# Patient Record
Sex: Female | Born: 2000 | Race: Black or African American | Hispanic: No | Marital: Single | Smoking: Never smoker
Health system: Southern US, Community
[De-identification: ages and names within clinical notes are randomized; demographics above are authoritative.]

## PROBLEM LIST (undated history)

## (undated) DIAGNOSIS — IMO0001 Reserved for inherently not codable concepts without codable children: Secondary | ICD-10-CM

## (undated) HISTORY — DX: Reserved for inherently not codable concepts without codable children: IMO0001

---
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
## 2019-08-19 ENCOUNTER — Encounter: Payer: Medicaid Other | Admitting: Family Medicine

## 2019-12-11 NOTE — Progress Notes (Deleted)
    SUBJECTIVE:   CHIEF COMPLAINT / HPI:   ***  PERTINENT  PMH / PSH: ***  OBJECTIVE:   There were no vitals taken for this visit.  ***  ASSESSMENT/PLAN:   No problem-specific Assessment & Plan notes found for this encounter.     Imaya Duffy, MD [facility]   

## 2019-12-11 NOTE — Patient Instructions (Incomplete)
It was a pleasure to see you today!  1.    Be Well,  Dr. Mahoney  

## 2019-12-12 ENCOUNTER — Ambulatory Visit: Payer: Medicaid Other | Admitting: Family Medicine

## 2019-12-20 ENCOUNTER — Ambulatory Visit (INDEPENDENT_AMBULATORY_CARE_PROVIDER_SITE_OTHER): Payer: Medicaid Other | Admitting: Family Medicine

## 2019-12-20 ENCOUNTER — Other Ambulatory Visit: Payer: Self-pay

## 2019-12-20 ENCOUNTER — Encounter: Payer: Self-pay | Admitting: Family Medicine

## 2019-12-20 VITALS — BP 92/72 | HR 73 | Wt 136.4 lb

## 2019-12-20 DIAGNOSIS — K5904 Chronic idiopathic constipation: Secondary | ICD-10-CM | POA: Diagnosis not present

## 2019-12-20 DIAGNOSIS — K581 Irritable bowel syndrome with constipation: Secondary | ICD-10-CM

## 2019-12-20 DIAGNOSIS — R1110 Vomiting, unspecified: Secondary | ICD-10-CM

## 2019-12-20 LAB — POCT URINE PREGNANCY: Preg Test, Ur: NEGATIVE

## 2019-12-20 MED ORDER — POLYETHYLENE GLYCOL 3350 17 GM/SCOOP PO POWD: 17.0000 g | Freq: Four times a day (QID) | ORAL | 6 refills | Status: DC

## 2019-12-20 NOTE — Progress Notes (Signed)
    SUBJECTIVE:   CHIEF COMPLAINT / HPI: nausea and vomiting  Reports worsening vomiting since middle of November.  Reports was 145 pounds and now down to 136 pounds.  Continues to have an appetite but unable to keep food down.  Reports vomiting 30 minutes after eating.  Feels like stomach is clenching after she eats.  Denies any hematemesis or bile in vomit.  She is able to keep down fluids.  Reports that she has changed her diet as she thought she may been lactose intolerant.  Has decreased fatty food intake.  Denies any fevers, urinary symptoms, vaginal bleeding or discharge.  Has had one episode of frothy diarrhea.  Denies any constipation.  Reports last bowel movement today.  Normal bowel movements have been 1-2 a month.  Endorses eating ice but no other pica symptoms.  Denies any bloody stool.  Reports has a history of constipation and was taking MiraLAX but stopped as it did not work.    PERTINENT  PMH / PSH:  Chronic constipation  OBJECTIVE:   BP 92/72   Pulse 73   Wt 136 lb 6.4 oz (61.9 kg)   LMP 11/25/2019   SpO2 99%   BMI 24.16 kg/m    General: Alert and oriented, no apparent distress  ENTM: No pharyengeal erythema, mucus membres moist Neck: nontender, no thyromegaly Cardiovascular: RRR with no murmurs noted Respiratory: CTA bilaterally  Gastrointestinal: Soft and firm but no distension.  Hypoactive Bowel sounds. RLQ abdominal pain.  Negative rebound, Murphy's sign.  No peritoneal signs Psych: Behavior and speech appropriate to situation. No SI/HI  ASSESSMENT/PLAN:   Chronic idiopathic constipation Has bowel movement every 1-2 months. History of constipation but stopped taking Miralax as it didn't work.  Abd exam soft but firm and mild tenderness to deep palpation on RLQ. No peritoneal signs.  Likely nausea/vomiting from constipation secondary to IBS -KUB today -CBC and CMP today -UPT negative -Miralax QID -Once KUB back can increase bowel regime until moves  bowels -Educated on the importance to continue to have a bowel movement at least every 2-3 days and not months  -Will call patient with results when received -If unable to move bowels and continues to have nausea and vomiting likely will need GI referral for possible admission to relieve stool burden. -Follow up with PCP as needed      Dana Allan, MD [facility]

## 2019-12-20 NOTE — Patient Instructions (Addendum)
It was nice meeting you  today!  Take Miralax 1 scoop 4 times a day until you have a bowel movement  I will call you with the results of your blood work and xray.  If you have any questions or concerns, please feel free to call the clinic.   Be well,  Dana Allan, MD [facility]    Irritable Bowel Syndrome, Adult  Irritable bowel syndrome (IBS) is a group of symptoms that affects the organs responsible for digestion (gastrointestinal or GI tract). IBS is not one specific disease. To regulate how the GI tract works, the body sends signals back and forth between the intestines and the brain. If you have IBS, there may be a problem with these signals. As a result, the GI tract does not function normally. The intestines may become more sensitive and overreact to certain things. This may be especially true when you eat certain foods or when you are under stress. There are four types of IBS. These may be determined based on the consistency of your stool (feces):  IBS with diarrhea.  IBS with constipation.  Mixed IBS.  Unsubtyped IBS. It is important to know which type of IBS you have. Certain treatments are more likely to be helpful for certain types of IBS. What are the causes? The exact cause of IBS is not known. What increases the risk? You may have a higher risk for IBS if you:  Are female.  Are younger than 45.  Have a family history of IBS.  Have a mental health condition, such as depression, anxiety, or post-traumatic stress disorder.  Have had a bacterial infection of your GI tract. What are the signs or symptoms? Symptoms of IBS vary from person to person. The main symptom is abdominal pain or discomfort. Other symptoms usually include one or more of the following:  Diarrhea, constipation, or both.  Abdominal swelling or bloating.  Feeling full after eating a small or regular-sized meal.  Frequent gas.  Mucus in the stool.  A feeling of having  more stool left after a bowel movement. Symptoms tend to come and go. They may be triggered by stress, mental health conditions, or certain foods. How is this diagnosed? This condition may be diagnosed based on a physical exam, your medical history, and your symptoms. You may have tests, such as:  Blood tests.  Stool test.  X-rays.  CT scan.  Colonoscopy. This is a procedure in which your GI tract is viewed with a long, thin, flexible tube. How is this treated? There is no cure for IBS, but treatment can help relieve symptoms. Treatment depends on the type of IBS you have, and may include:  Changes to your diet, such as: ? Avoiding foods that cause symptoms. ? Drinking more water. ? Following a low-FODMAP (fermentable oligosaccharides, disaccharides, monosaccharides, and polyols) diet for up to 6 weeks, or as told by your health care provider. FODMAPs are sugars that are hard for some people to digest. ? Eating more fiber. ? Eating medium-sized meals at the same times every day.  Medicines. These may include: ? Fiber supplements, if you have constipation. ? Medicine to control diarrhea (antidiarrheal medicines). ? Medicine to help control muscle tightening (spasms) in your GI tract (antispasmodic medicines). ? Medicines to help with mental health conditions, such as antidepressants or tranquilizers.  Talk therapy or counseling.  Working with a diet and nutrition specialist (dietitian) to help create a food plan that is right for you.  Managing  your stress. Follow these instructions at home: Eating and drinking  Eat a healthy diet.  Eat medium-sized meals at about the same time every day. Do not eat large meals.  Gradually eat more fiber-rich foods. These include whole grains, fruits, and vegetables. This may be especially helpful if you have IBS with constipation.  Eat a diet low in FODMAPs.  Drink enough fluid to keep your urine pale yellow.  Keep a journal of foods  that seem to trigger symptoms.  Avoid foods and drinks that: ? Contain added sugar. ? Make your symptoms worse. Dairy products, caffeinated drinks, and carbonated drinks can make symptoms worse for some people. General instructions  Take over-the-counter and prescription medicines and supplements only as told by your health care provider.  Get enough exercise. Do at least 150 minutes of moderate-intensity exercise each week.  Manage your stress. Getting enough sleep and exercise can help you manage stress.  Keep all follow-up visits as told by your health care provider and therapist. This is important. Alcohol Use  Do not drink alcohol if: ? Your health care provider tells you not to drink. ? You are pregnant, may be pregnant, or are planning to become pregnant.  If you drink alcohol, limit how much you have: ? 0-1 drink a day for women. ? 0-2 drinks a day for men.  Be aware of how much alcohol is in your drink. In the [place] one drink equals one typical bottle of beer (12 oz), one-half glass of wine (5 oz), or one shot of hard liquor (1 oz). Contact a health care provider if you have:  Constant pain.  Weight loss.  Difficulty or pain when swallowing.  Diarrhea that gets worse. Get help right away if you have:  Severe abdominal pain.  Fever.  Diarrhea with symptoms of dehydration, such as dizziness or dry mouth.  Bright red blood in your stool.  Stool that is black and tarry.  Abdominal swelling.  Vomiting that does not stop.  Blood in your vomit. Summary  Irritable bowel syndrome (IBS) is not one specific disease. It is a group of symptoms that affects digestion.  Your intestines may become more sensitive and overreact to certain things. This may be especially true when you eat certain foods or when you are under stress.  There is no cure for IBS, but treatment can help relieve symptoms. This information is not intended to replace advice given to you by your  health care provider. Make sure you discuss any questions you have with your health care provider. Document Revised: 12/16/2016 Document Reviewed: 12/16/2016 Elsevier Patient Education  2020 [employer].

## 2019-12-21 ENCOUNTER — Ambulatory Visit
Admission: RE | Admit: 2019-12-21 | Discharge: 2019-12-21 | Disposition: A | Discharge status: Home / Self Care | Payer: Medicaid Other | Source: Ambulatory Visit | Attending: Family Medicine | Admitting: Family Medicine

## 2019-12-21 DIAGNOSIS — R1110 Vomiting, unspecified: Secondary | ICD-10-CM

## 2019-12-21 LAB — CBC WITH DIFFERENTIAL/PLATELET
Basophils Absolute: 0 10*3/uL (ref 0.0–0.2)
Basos: 1 %
EOS (ABSOLUTE): 0 10*3/uL (ref 0.0–0.4)
Eos: 1 %
Hematocrit: 33.3 % — ABNORMAL LOW (ref 34.0–46.6)
Hemoglobin: 10.8 g/dL — ABNORMAL LOW (ref 11.1–15.9)
Immature Grans (Abs): 0 10*3/uL (ref 0.0–0.1)
Immature Granulocytes: 0 %
Lymphocytes Absolute: 3.5 10*3/uL — ABNORMAL HIGH (ref 0.7–3.1)
Lymphs: 56 %
MCH: 25.3 pg — ABNORMAL LOW (ref 26.6–33.0)
MCHC: 32.4 g/dL (ref 31.5–35.7)
MCV: 78 fL — ABNORMAL LOW (ref 79–97)
Monocytes Absolute: 0.7 10*3/uL (ref 0.1–0.9)
Monocytes: 11 %
Neutrophils Absolute: 1.9 10*3/uL (ref 1.4–7.0)
Neutrophils: 31 %
Platelets: 198 10*3/uL (ref 150–450)
RBC: 4.27 x10E6/uL (ref 3.77–5.28)
RDW: 16.7 % — ABNORMAL HIGH (ref 11.7–15.4)
WBC: 6 10*3/uL (ref 3.4–10.8)

## 2019-12-21 LAB — COMPREHENSIVE METABOLIC PANEL
ALT: 9 IU/L (ref 0–32)
AST: 13 IU/L (ref 0–40)
Albumin/Globulin Ratio: 1.3 (ref 1.2–2.2)
Albumin: 4.4 g/dL (ref 3.9–5.0)
Alkaline Phosphatase: 63 IU/L (ref 42–106)
BUN/Creatinine Ratio: 7 — ABNORMAL LOW (ref 9–23)
BUN: 7 mg/dL (ref 6–20)
Bilirubin Total: 0.8 mg/dL (ref 0.0–1.2)
CO2: 23 mmol/L (ref 20–29)
Calcium: 9.9 mg/dL (ref 8.7–10.2)
Chloride: 104 mmol/L (ref 96–106)
Creatinine, Ser: 0.95 mg/dL (ref 0.57–1.00)
GFR calc Af Amer: 100 mL/min/{1.73_m2} (ref >59)
GFR calc non Af Amer: 86 mL/min/{1.73_m2} (ref >59)
Globulin, Total: 3.5 g/dL (ref 1.5–4.5)
Glucose: 78 mg/dL (ref 65–99)
Potassium: 4.1 mmol/L (ref 3.5–5.2)
Sodium: 139 mmol/L (ref 134–144)
Total Protein: 7.9 g/dL (ref 6.0–8.5)

## 2019-12-22 ENCOUNTER — Telehealth: Payer: Self-pay | Admitting: Family Medicine

## 2019-12-22 NOTE — Telephone Encounter (Signed)
Called to discuss results of xray and labs.  LVM to return call.    Dana Allan, MD [facility]

## 2019-12-23 ENCOUNTER — Telehealth: Payer: Self-pay | Admitting: Family Medicine

## 2019-12-23 ENCOUNTER — Encounter: Payer: Self-pay | Admitting: Family Medicine

## 2019-12-23 ENCOUNTER — Other Ambulatory Visit: Payer: Self-pay | Admitting: Family Medicine

## 2019-12-23 MED ORDER — SENNA 8.6 MG PO TABS: 1.0000 | ORAL_TABLET | Freq: Two times a day (BID) | ORAL | 0 refills | Status: DC

## 2019-12-23 NOTE — Progress Notes (Signed)
Spoke with patient about results of abdominal xray and large amount of stool seen.  Advised to increase Miralax to 2.5 scoops TID and added Senna BID. Increase water intake and fiber. If no BM over the weekend patient will call clinic and follow up with PCP.     Dana Allan, MD [facility]

## 2019-12-23 NOTE — Telephone Encounter (Signed)
Called patient to discuss results of recent labs and xray.  No answer.  Unsure if right number.  Was able to contact Grandmother to confirm correct number in chart.  Will attempt to call patient after 4 pm today.  Dana Allan, MD [facility]

## 2019-12-23 NOTE — Assessment & Plan Note (Addendum)
Has bowel movement every 1-2 months. History of constipation but stopped taking Miralax as it didn't work.  Abd exam soft but firm and mild tenderness to deep palpation on RLQ. No peritoneal signs.  Likely nausea/vomiting from constipation secondary to IBS -KUB today -CBC and CMP today -UPT negative -Miralax QID -Once KUB back can increase bowel regime until moves bowels -Educated on the importance to continue to have a bowel movement at least every 2-3 days and not months  -Will call patient with results when received -If unable to move bowels and continues to have nausea and vomiting likely will need GI referral for possible admission to relieve stool burden. -Follow up with PCP as needed

## 2019-12-23 NOTE — Assessment & Plan Note (Deleted)
Has bowel movement every 1-2 months. History of constipation but stopped taking Miralax as it didn't work.  Abd exam soft but firm and mild tenderness to deep palpation on RLQ. No peritoneal signs.  Likely vomiting secondary from constipation. -KUB today -CBC and CMP today -UPT negative -Miralax BID -Once KUB back can increase bowel regime until moves bowels -Educated on the importance to continue to have a bowel movement at least every 2-3 days and not months  -Will call patient with results when received -Follow up with PCP as needed

## 2020-01-02 ENCOUNTER — Other Ambulatory Visit: Payer: Self-pay

## 2020-01-02 DIAGNOSIS — U071 COVID-19: Secondary | ICD-10-CM | POA: Diagnosis not present

## 2020-01-02 DIAGNOSIS — R0600 Dyspnea, unspecified: Secondary | ICD-10-CM | POA: Diagnosis not present

## 2020-01-02 DIAGNOSIS — R509 Fever, unspecified: Secondary | ICD-10-CM | POA: Diagnosis present

## 2020-01-02 DIAGNOSIS — J189 Pneumonia, unspecified organism: Secondary | ICD-10-CM | POA: Diagnosis not present

## 2020-01-02 LAB — RESP PANEL BY RT-PCR (FLU A&B, COVID) ARPGX2
Influenza A by PCR: NEGATIVE
Influenza B by PCR: NEGATIVE
SARS Coronavirus 2 by RT PCR: POSITIVE — AB

## 2020-01-02 NOTE — ED Triage Notes (Signed)
Pt in with co fever, body aches and headache since yesterday. Pt states she felt shob, no distress noted at this time.

## 2020-01-03 ENCOUNTER — Emergency Department
Admission: EM | Admit: 2020-01-03 | Discharge: 2020-01-03 | Disposition: A | Discharge status: Home / Self Care | Payer: Medicaid Other | Attending: Emergency Medicine | Admitting: Emergency Medicine

## 2020-01-03 ENCOUNTER — Emergency Department: Payer: Medicaid Other

## 2020-01-03 DIAGNOSIS — J189 Pneumonia, unspecified organism: Secondary | ICD-10-CM | POA: Diagnosis not present

## 2020-01-03 DIAGNOSIS — U071 COVID-19: Secondary | ICD-10-CM

## 2020-01-03 DIAGNOSIS — R0600 Dyspnea, unspecified: Secondary | ICD-10-CM | POA: Diagnosis not present

## 2020-01-03 MED ORDER — HYDROCODONE-HOMATROPINE 5-1.5 MG/5ML PO SYRP: 5.0000 mL | ORAL_SOLUTION | Freq: Four times a day (QID) | ORAL | 0 refills | Status: DC | PRN

## 2020-01-03 MED ORDER — ALBUTEROL SULFATE HFA 108 (90 BASE) MCG/ACT IN AERS: INHALATION_SPRAY | RESPIRATORY_TRACT | 0 refills | Status: DC

## 2020-01-03 MED ORDER — ONDANSETRON 4 MG PO TBDP: ORAL_TABLET | ORAL | 0 refills | Status: DC

## 2020-01-03 NOTE — Discharge Instructions (Signed)
As we discussed, although you have tested positive for COVID-19 (coronavirus), you do not need to be hospitalized at this time.  Read through all the included information including the recommendations from the CDC.  We recommend that you self-quarantine at home with your immediate family only (people with whom you have already been in contact) for 10-14 days after your fever has gone away (without taking medication to make your temperature come down, such as Tylenol (acetaminophen)), after your respiratory symptoms have improved, and after at least 14 days have passed since your symptoms first appeared.  You should have as minimal contact as possible with anyone else including close family as per the CDC paperwork guidelines listed below. Follow-up with your doctor by phone or online as needed and return immediately to the emergency department or call 911 only if you develop new or worsening symptoms that concern you.  If you were prescribed any medications, please use them as instructed.  If you were given information for the COVID-19 antibody infusion treatment clinic, please call them and leave your contact information.  This can be a very effective and important treatment method, and you should discuss with them if you qualify for treatment.  They may even have transportation services available to them from [place] if you require them.  You can find up-to-date information about COVID-19 in [place] by calling the [place] Coronavirus Helpline: 1-866-462-3821. You may also call 2-1-1, or 888-892-1162, or additional resources.  You can also find information online at https://www.ncdhhs.gov/divisions/public-health/coronavirus-disease-2019-covid-19-response-[place], or on the Center for Disease Control (CDC) website at https://www.cdc.gov/coronavirus/2019-ncov/index.html.  

## 2020-01-03 NOTE — ED Provider Notes (Signed)
[facility] Emergency Department Provider Note  ____________________________________________   Event Date/Time   First MD Initiated Contact with Patient 01/03/20 0138     (approximate)  I have reviewed the triage vital signs and the nursing notes.   HISTORY  Chief Complaint Generalized Body Aches, Shortness of Breath (Fever), and Fever    HPI Linda Weiss is a 19 y.o. female with no chronic medical issues other than IBS who presents for evaluation of about 24 hours of COVID-like symptoms including fever, sore throat, loss of smell and taste, cough.  No significant shortness of breath.  Denies chest pain, abdominal pain, nausea, and vomiting.  Positive for general malaise and fatigue.  No known COVID-19 contacts.  She has received one of the immunizations but has not received her second shot yet.  Nothing in particular makes his symptoms better or worse and they are moderate in severity.         Past Medical History:  Diagnosis Date  . Menarche 2012   Age 19, regular every 28 days    Patient Active Problem List   Diagnosis Date Noted  . Vaginal itching 01/12/2018  . Dandruff 11/09/2017  . Eye strain, bilateral 11/09/2017  . Nonintractable episodic headache 11/09/2017  . Acne 04/09/2017  . Chronic idiopathic constipation 06/03/2016  . Contraceptive management 06/03/2016  . Hammertoe of left foot 10/10/2015    No past surgical history on file.  Prior to Admission medications   Medication Sig Start Date End Date Taking? Authorizing Provider  albuterol (VENTOLIN HFA) 108 (90 Base) MCG/ACT inhaler Inhale 2-4 puffs by mouth every 4 hours as needed for wheezing, cough, and/or shortness of breath 01/03/20  Yes Loleta Rose, MD  HYDROcodone-homatropine [facility]) 5-1.5 MG/5ML syrup Take 5 mLs by mouth every 6 (six) hours as needed for cough. 01/03/20  Yes Loleta Rose, MD  ondansetron (ZOFRAN ODT) 4 MG disintegrating tablet Allow 1-2 tablets to  dissolve in your mouth every 8 hours as needed for nausea/vomiting 01/03/20  Yes Loleta Rose, MD  fluconazole (DIFLUCAN) 150 MG tablet Take one tablet today followed by second tablet in 72 hours if symptoms persist. 03/25/18   Freddrick March, MD  polyethylene glycol powder (GLYCOLAX/MIRALAX) 17 GM/SCOOP powder Take 17 g by mouth in the morning, at noon, in the evening, and at bedtime. 12/20/19   Dana Allan, MD  rizatriptan (MAXALT) 5 MG tablet Take 1 tablet (5 mg total) by mouth once as needed for up to 6 days for migraine. May repeat in 2 hours if needed 01/12/18 01/18/18  Dollene Cleveland, DO  senna (SENOKOT) 8.6 MG TABS tablet Take 1 tablet (8.6 mg total) by mouth in the morning and at bedtime. 12/23/19   Dana Allan, MD    Allergies Hydrocodone and Amoxicillin  No family history on file.  Social History Social History   Tobacco Use  . Smoking status: Never Smoker  . Smokeless tobacco: Never Used  Substance Use Topics  . Alcohol use: No  . Drug use: No    Review of Systems Constitutional: Positive for fever, malaise, and fatigue. Eyes: No visual changes. ENT: Positive for sore throat and loss of smell and taste. Cardiovascular: Denies chest pain. Respiratory: Some cough, minimal shortness of breath. Gastrointestinal: No abdominal pain.  No nausea, no vomiting.  No diarrhea.  No constipation. Genitourinary: Negative for dysuria. Musculoskeletal: Negative for neck pain.  Negative for back pain. Integumentary: Negative for rash. Neurological: Negative for headaches, focal weakness or numbness.  ____________________________________________   PHYSICAL EXAM:  VITAL SIGNS: ED Triage Vitals  Enc Vitals Group     BP 01/02/20 2242 (!) 116/49     Pulse Rate 01/02/20 2242 70     Resp 01/02/20 2242 20     Temp 01/02/20 2242 99 F (37.2 C)     Temp Source 01/02/20 2242 Oral     SpO2 01/02/20 2242 100 %     Weight 01/02/20 2243 61.2 kg (135 lb)     Height 01/02/20 2243 1.6  m (5\' 3" )     Head Circumference --      Peak Flow --      Pain Score 01/02/20 2243 8     Pain Loc --      Pain Edu? --      Excl. in GC? --     Constitutional: Alert and oriented.  Eyes: Conjunctivae are normal.  Head: Atraumatic. Nose: No congestion/rhinnorhea. Mouth/Throat: Patient is wearing a mask. Neck: No stridor.  No meningeal signs.   Cardiovascular: Normal rate, regular rhythm. Good peripheral circulation. Respiratory: Normal respiratory effort.  No retractions. Gastrointestinal: Soft and nontender. No distention.  Musculoskeletal: No lower extremity tenderness nor edema. No gross deformities of extremities. Neurologic:  Normal speech and language. No gross focal neurologic deficits are appreciated.  Skin:  Skin is warm, dry and intact. Psychiatric: Mood and affect are normal. Speech and behavior are normal.  ____________________________________________   LABS (all labs ordered are listed, but only abnormal results are displayed)  Labs Reviewed  RESP PANEL BY RT-PCR (FLU A&B, COVID) ARPGX2 - Abnormal; Notable for the following components:      Result Value   SARS Coronavirus 2 by RT PCR POSITIVE (*)    All other components within normal limits   ____________________________________________  EKG  No indication for emergent EKG ____________________________________________  RADIOLOGY I, 01/04/20, personally viewed and evaluated these images (plain radiographs) as part of my medical decision making, as well as reviewing the written report by the radiologist.  ED MD interpretation: No acute abnormalities on chest x-ray  Official radiology report(s): DG Chest Portable 1 View  Result Date: 01/03/2020 CLINICAL DATA:  Acute dyspnea, COVID pneumonia EXAM: PORTABLE CHEST 1 VIEW COMPARISON:  09/23/2006 FINDINGS: Lungs are clear. No pneumothorax or pleural effusion. Cardiac size within normal limits. Pulmonary vascularity is normal. Mild thoracic dextroscoliosis has  developed in the interval. No acute bone abnormality. IMPRESSION: No active disease. Electronically Signed   By: 09/25/2006 MD   On: 01/03/2020 01:50    ____________________________________________   PROCEDURES   Procedure(s) performed (including Critical Care):  Procedures   ____________________________________________   INITIAL IMPRESSION / MDM / ASSESSMENT AND PLAN / ED COURSE  As part of my medical decision making, I reviewed the following data within the electronic MEDICAL RECORD NUMBER Nursing notes reviewed and incorporated, Labs reviewed , Old chart reviewed, Radiograph reviewed , Notes from prior ED visits and [place] Controlled Substance Database   Patient tested positive for COVID-19 in the department tonight.  I personally reviewed the patient's imaging and agree with the radiologist's interpretation that there is no evidence of COVID pneumonia or other acute abnormalities on chest x-ray.  Vital signs are stable, no hypoxemia, no tachycardia.  No difficulty breathing at this time.  I had my usual customary COVID-19 discussion with the patient including outpatient management recommendations and return precautions.  She has no chronic conditions that would qualify her for monoclonal antibody infusions.  The patient understands and  agrees with the plan for outpatient treatment.  I provided prescriptions as listed below in case her symptoms worsen over the next couple of weeks and she needs them.         ____________________________________________  FINAL CLINICAL IMPRESSION(S) / ED DIAGNOSES  Final diagnoses:  COVID-19     MEDICATIONS GIVEN DURING THIS VISIT:  Medications - No data to display   ED Discharge Orders         Ordered    albuterol (VENTOLIN HFA) 108 (90 Base) MCG/ACT inhaler       Note to Pharmacy: Pharmacy may substitute brand and size for insurance-approved equivalent   01/03/20 0215    HYDROcodone-homatropine (HYCODAN) 5-1.5 MG/5ML syrup  Every 6  hours PRN        01/03/20 0215    ondansetron (ZOFRAN ODT) 4 MG disintegrating tablet        01/03/20 0215          *Please note:  Berneice Zettlemoyer was evaluated in Emergency Department on 01/03/2020 for the symptoms described in the history of present illness. She was evaluated in the context of the global COVID-19 pandemic, which necessitated consideration that the patient might be at risk for infection with the SARS-CoV-2 virus that causes COVID-19. Institutional protocols and algorithms that pertain to the evaluation of patients at risk for COVID-19 are in a state of rapid change based on information released by regulatory bodies including the CDC and federal and state organizations. These policies and algorithms were followed during the patient's care in the ED.  Some ED evaluations and interventions may be delayed as a result of limited staffing during and after the pandemic.*  Note:  This document was prepared using Dragon voice recognition software and may include unintentional dictation errors.   Loleta Rose, MD 01/03/20 [phone]

## 2020-01-04 ENCOUNTER — Telehealth: Payer: Self-pay

## 2020-01-04 NOTE — Telephone Encounter (Signed)
Transition Care Management Follow-up Telephone Call  Date of discharge and from where: 01/03/2020 [facility] ED  How have you been since you were released from the hospital? Having fever, sore throat.   Any questions or concerns? No  Items Reviewed:  Did the pt receive and understand the discharge instructions provided? Yes   Medications obtained and verified? No   Other? No   Any new allergies since your discharge? No   Dietary orders reviewed? Yes  Do you have support at home? Yes   Functional Questionnaire: (I = Independent and D = Dependent) ADLs: I  Bathing/Dressing- I  Meal Prep- I  Eating- I  Maintaining continence- I  Transferring/Ambulation- I  Managing Meds- I  Follow up appointments reviewed:   PCP Hospital f/u appt confirmed? Yes   Scheduled to see [facility]  on 01/16/2020 @ 10:30am.  Specialist Hospital f/u appt confirmed? No    Are transportation arrangements needed? No   If their condition worsens, is the pt aware to call PCP or go to the Emergency Dept.? Yes  Was the patient provided with contact information for the PCP's office or ED? Yes  Was to pt encouraged to call back with questions or concerns? Yes

## 2020-01-04 NOTE — Telephone Encounter (Signed)
Transition Care Management Unsuccessful Follow-up Telephone Call  Date of discharge and from where:  01/03/2020  Attempts:  1st Attempt  Reason for unsuccessful TCM follow-up call:  Left voice message

## 2020-01-11 ENCOUNTER — Telehealth: Payer: Medicaid Other

## 2020-01-11 NOTE — Telephone Encounter (Signed)
Patient calls nurse line requesting her positive covid test to be faxed to [facility] in [place]. Patient reports she is due in court tomorrow, however she has Covid. Result faxed to [facility] per her request.   Fax: [phone]

## 2020-01-14 DIAGNOSIS — Z20822 Contact with and (suspected) exposure to covid-19: Secondary | ICD-10-CM | POA: Diagnosis not present

## 2020-03-08 ENCOUNTER — Emergency Department
Admission: EM | Admit: 2020-03-08 | Discharge: 2020-03-08 | Disposition: A | Discharge status: Home / Self Care | Payer: Medicaid Other | Attending: Emergency Medicine | Admitting: Emergency Medicine

## 2020-03-08 ENCOUNTER — Encounter: Payer: Self-pay | Admitting: Emergency Medicine

## 2020-03-08 ENCOUNTER — Other Ambulatory Visit: Payer: Self-pay

## 2020-03-08 DIAGNOSIS — T6594XA Toxic effect of unspecified substance, undetermined, initial encounter: Secondary | ICD-10-CM

## 2020-03-08 DIAGNOSIS — T50901A Poisoning by unspecified drugs, medicaments and biological substances, accidental (unintentional), initial encounter: Secondary | ICD-10-CM | POA: Diagnosis not present

## 2020-03-08 DIAGNOSIS — T5194XA Toxic effect of unspecified alcohol, undetermined, initial encounter: Secondary | ICD-10-CM | POA: Diagnosis not present

## 2020-03-08 DIAGNOSIS — X58XXXA Exposure to other specified factors, initial encounter: Secondary | ICD-10-CM | POA: Diagnosis not present

## 2020-03-08 NOTE — ED Triage Notes (Signed)
Pt to ED from home states she bought listerine a couple days ago, opened it tonight for the first time which still had the seal on that she took off.  But when she swished she didn't feel the burn and thinks it was water.  Patient wants to make sure it wasn't something else.  Denies pain, SOB, or other complaints.  A&Ox4, chest rise even and unlabored, in NAD at this time.

## 2020-03-08 NOTE — Discharge Instructions (Addendum)
Your exam is normal, and I don't believe you ingested anything that was harmful from this mouthwash bottle.  Follow-up with your primary provider or return to the ED if needed.

## 2020-03-08 NOTE — ED Notes (Signed)
Patient denies any discomfort at this time.

## 2020-03-09 NOTE — ED Provider Notes (Signed)
[facility] Emergency Department Provider Note ____________________________________________  Time seen: 2114  I have reviewed the triage vital signs and the nursing notes.  HISTORY  Chief Complaint  Ingestion   HPI Linda Weiss is a 20 y.o. female presents herself to the ED for evaluation of concern for an unknown near ingestion.  Patient describes opening the new bottle of Listerine she had purchased 2 days earlier.  She denies any signs of tampering, as the safety tab as well as the tamper evident seal was present.  She reports gargling the Listerine brand mouthwash, and reported she did not experience the typical alcohol burn that she have come to know.  Patient reports the particular bottle contents did not appear again cloudy,  nor was the bottle in any way damaged.  Patient reports that that was the cool-mist brand, which is typically blue but it was a small travel size which she could not clearly see the contents until she reported into the cab.  Patient denies any nausea, vomiting, or dizziness.  Denies any throat irritation, mucous membrane involvement, or dyspnea.  She is presenting with concern of a possible toxic ingestion, and requesting if there is any testing that can be done to determine what she may have gargled and spit out.  Past Medical History:  Diagnosis Date  . Menarche 2012   Age 57, regular every 28 days    Patient Active Problem List   Diagnosis Date Noted  . Vaginal itching 01/12/2018  . Dandruff 11/09/2017  . Eye strain, bilateral 11/09/2017  . Nonintractable episodic headache 11/09/2017  . Acne 04/09/2017  . Chronic idiopathic constipation 06/03/2016  . Contraceptive management 06/03/2016  . Hammertoe of left foot 10/10/2015    History reviewed. No pertinent surgical history.  Prior to Admission medications   Medication Sig Start Date End Date Taking? Authorizing Provider  albuterol (VENTOLIN HFA) 108 (90 Base) MCG/ACT  inhaler Inhale 2-4 puffs by mouth every 4 hours as needed for wheezing, cough, and/or shortness of breath 01/03/20   Loleta Rose, MD  fluconazole (DIFLUCAN) 150 MG tablet Take one tablet today followed by second tablet in 72 hours if symptoms persist. 03/25/18   Freddrick March, MD  HYDROcodone-homatropine [facility]) 5-1.5 MG/5ML syrup Take 5 mLs by mouth every 6 (six) hours as needed for cough. 01/03/20   Loleta Rose, MD  ondansetron (ZOFRAN ODT) 4 MG disintegrating tablet Allow 1-2 tablets to dissolve in your mouth every 8 hours as needed for nausea/vomiting 01/03/20   Loleta Rose, MD  polyethylene glycol powder (GLYCOLAX/MIRALAX) 17 GM/SCOOP powder Take 17 g by mouth in the morning, at noon, in the evening, and at bedtime. 12/20/19   Dana Allan, MD  rizatriptan (MAXALT) 5 MG tablet Take 1 tablet (5 mg total) by mouth once as needed for up to 6 days for migraine. May repeat in 2 hours if needed 01/12/18 01/18/18  Dollene Cleveland, DO  senna (SENOKOT) 8.6 MG TABS tablet Take 1 tablet (8.6 mg total) by mouth in the morning and at bedtime. 12/23/19   Dana Allan, MD    Allergies Hydrocodone and Amoxicillin  History reviewed. No pertinent family history.  Social History Social History   Tobacco Use  . Smoking status: Never Smoker  . Smokeless tobacco: Never Used  Substance Use Topics  . Alcohol use: No  . Drug use: No    Review of Systems  Constitutional: Negative for fever. Eyes: Negative for visual changes. ENT: Negative for sore throat. Cardiovascular: Negative for  chest pain. Respiratory: Negative for shortness of breath. Gastrointestinal: Negative for abdominal pain, vomiting and diarrhea. Genitourinary: Negative for dysuria. Musculoskeletal: Negative for back pain. Skin: Negative for rash. Neurological: Negative for headaches, focal weakness or numbness. ____________________________________________  PHYSICAL EXAM:  VITAL SIGNS: ED Triage Vitals  Enc Vitals Group      BP 03/08/20 2026 122/79     Pulse Rate 03/08/20 2026 76     Resp 03/08/20 2026 14     Temp 03/08/20 2026 98.5 F (36.9 C)     Temp Source 03/08/20 2026 Oral     SpO2 03/08/20 2026 100 %     Weight 03/08/20 2027 135 lb (61.2 kg)     Height 03/08/20 2027 5\' 3"  (1.6 m)     Head Circumference --      Peak Flow --      Pain Score 03/08/20 2027 0     Pain Loc --      Pain Edu? --      Excl. in GC? --     Constitutional: Alert and oriented. Well appearing and in no distress. Head: Normocephalic and atraumatic. Eyes: Conjunctivae are normal. PERRL. Normal extraocular movements Ears: Canals clear. TMs intact bilaterally. Nose: No congestion/rhinorrhea/epistaxis. Mouth/Throat: Mucous membranes are moist. Neck: Supple. No thyromegaly. Hematological/Lymphatic/Immunological: No cervical lymphadenopathy. Cardiovascular: Normal rate, regular rhythm. Normal distal pulses. Respiratory: Normal respiratory effort. No wheezes/rales/rhonchi. Gastrointestinal: Soft and nontender. No distention. Musculoskeletal: Nontender with normal range of motion in all extremities.  Neurologic:  Normal gait without ataxia. Normal speech and language. No gross focal neurologic deficits are appreciated. Skin:  Skin is warm, dry and intact. No rash noted. Psychiatric: Mood and affect are normal. Patient exhibits appropriate insight and judgment. ____________________________________________  PROCEDURES  Procedures ____________________________________________  INITIAL IMPRESSION / ASSESSMENT AND PLAN / ED COURSE  ED evaluation of a near ingestion of a single dose of a Listerine brand mouthwash.  Patient was concerned because the [place] bottle that she had purchased, did not have the typical alcohol smell or burn as she gargled it.  She denies any ingestion or swallowing.  She has had no complaints in the interim.  She presented with concern because the vital contents appeared in her words "flat."  I advised the  patient that there is no testing that we can perform on her, with the exception of urine drug testing for illicit drug use.  I also advised that likely the bottle was not tampered with, and after we reviewed some Google images is likely that she had a no/low alcohol version of Listerine.  Patient is overall reassured at this time, and is discharged follow-up with primary provider without any need for any further testing.  Return precautions have been discussed.   Linda Weiss was evaluated in Emergency Department on 03/09/2020 for the symptoms described in the history of present illness. She was evaluated in the context of the global COVID-19 pandemic, which necessitated consideration that the patient might be at risk for infection with the SARS-CoV-2 virus that causes COVID-19. Institutional protocols and algorithms that pertain to the evaluation of patients at risk for COVID-19 are in a state of rapid change based on information released by regulatory bodies including the CDC and federal and state organizations. These policies and algorithms were followed during the patient's care in the ED. ____________________________________________  FINAL CLINICAL IMPRESSION(S) / ED DIAGNOSES  Final diagnoses:  Ingestion of nontoxic substance, undetermined intent, initial encounter      Molly Savarino, 05/09/2020, PA-C  03/09/20 Eden Lathe, MD 03/09/20 [phone]

## 2020-04-06 ENCOUNTER — Ambulatory Visit: Payer: Medicaid Other | Admitting: Family Medicine

## 2020-05-22 ENCOUNTER — Ambulatory Visit: Payer: Medicaid Other

## 2020-05-23 ENCOUNTER — Encounter: Payer: Self-pay | Admitting: Family Medicine

## 2020-05-23 ENCOUNTER — Other Ambulatory Visit (HOSPITAL_COMMUNITY)
Admission: RE | Admit: 2020-05-23 | Discharge: 2020-05-23 | Disposition: A | Discharge status: Home / Self Care | Payer: Medicaid Other | Source: Ambulatory Visit | Attending: Family Medicine | Admitting: Family Medicine

## 2020-05-23 ENCOUNTER — Other Ambulatory Visit: Payer: Self-pay

## 2020-05-23 ENCOUNTER — Ambulatory Visit: Payer: Medicaid Other | Admitting: Family Medicine

## 2020-05-23 VITALS — BP 110/56 | HR 93 | Ht 63.0 in | Wt 143.2 lb

## 2020-05-23 DIAGNOSIS — Z3044 Encounter for surveillance of vaginal ring hormonal contraceptive device: Secondary | ICD-10-CM

## 2020-05-23 DIAGNOSIS — N760 Acute vaginitis: Secondary | ICD-10-CM | POA: Diagnosis not present

## 2020-05-23 DIAGNOSIS — B9689 Other specified bacterial agents as the cause of diseases classified elsewhere: Secondary | ICD-10-CM

## 2020-05-23 DIAGNOSIS — N926 Irregular menstruation, unspecified: Secondary | ICD-10-CM

## 2020-05-23 DIAGNOSIS — N898 Other specified noninflammatory disorders of vagina: Secondary | ICD-10-CM

## 2020-05-23 HISTORY — DX: Irregular menstruation, unspecified: N92.6

## 2020-05-23 LAB — POCT WET PREP (WET MOUNT)
Clue Cells Wet Prep Whiff POC: POSITIVE
Trichomonas Wet Prep HPF POC: ABSENT

## 2020-05-23 LAB — POCT URINE PREGNANCY: Preg Test, Ur: NEGATIVE

## 2020-05-23 MED ORDER — METRONIDAZOLE 500 MG PO TABS: 500.0000 mg | ORAL_TABLET | Freq: Two times a day (BID) | ORAL | 0 refills | Status: DC

## 2020-05-23 NOTE — Assessment & Plan Note (Signed)
We discussed various birth control options which included: OCPs, Depo injection, Nexplanon, IUDs.  She did not feel strongly about starting birth control today but was grateful for the conversation.  She reported that she would return to clinic when she felt she was ready for a further discussion and decision about birth control.

## 2020-05-23 NOTE — Assessment & Plan Note (Signed)
Physical and wet prep consistent with bacterial vaginosis.  Pregnancy test negative.  We discussed this is a overgrowth of typical vaginal flora. -Metronidazole 500 mg twice daily for 7 days -Follow-up HIV, RPR, GC/chlamydia

## 2020-05-23 NOTE — Patient Instructions (Addendum)
Bacterial vaginosis: The test we did in clinic today was positive for bacterial vaginosis.  This is an overgrowth of your normal vaginal flora.  We generally treated with 7 days of antibiotics.  I recommend that you come back to clinic if you complete your antibiotic course and feel you still have bothersome symptoms.  Birth control: It sounds like you are familiar with many of the options for birth control.  I would love to help you figure out a good birth control for you.  Please feel free to return to clinic to discuss or obtain birth control options.  Safe Sex Practicing safe sex means taking steps before and during sex to reduce your risk of:  Getting an STI (sexually transmitted infection).  Giving your partner an STI.  Unwanted or unplanned pregnancy. How to practice safe sex Ways you can practice safe sex  Limit your sexual partners to only one partner who is having sex with only you.  Avoid using alcohol and drugs before having sex. Alcohol and drugs can affect your judgment.  Before having sex with a new partner: ? Talk to your partner about past partners, past STIs, and drug use. ? Get screened for STIs and discuss the results with your partner. Ask your partner to get screened too.  Check your body regularly for sores, blisters, rashes, or unusual discharge. If you notice any of these problems, visit your health care provider.  Avoid sexual contact if you have symptoms of an infection or you are being treated for an STI.  While having sex, use a condom. Make sure to: ? Use a condom every time you have vaginal, oral, or anal sex. Both females and males should wear condoms during oral sex. ? Keep condoms in place from the beginning to the end of sexual activity. ? Use a latex condom, if possible. Latex condoms offer the best protection. ? Use only water-based lubricants with a condom. Using petroleum-based lubricants or oils will weaken the condom and increase the chance that  it will break.   Ways your health care provider can help you practice safe sex  See your health care provider for regular screenings, exams, and tests for STIs.  Talk with your health care provider about what kind of birth control (contraception) is best for you.  Get vaccinated against hepatitis B and human papillomavirus (HPV).  If you are at risk of being infected with HIV (human immunodeficiency virus), talk with your health care provider about taking a prescription medicine to prevent HIV infection. You are at risk for HIV if you: ? Are a man who has sex with other men. ? Are sexually active with more than one partner. ? Take drugs by injection. ? Have a sex partner who has HIV. ? Have unprotected sex. ? Have sex with someone who has sex with both men and women. ? Have had an STI.   Follow these instructions at home:  Take over-the-counter and prescription medicines only as told by your health care provider.  Keep all follow-up visits. This is important. Where to find more information  Centers for Disease Control and Prevention: FootballExhibition.com.br  Planned Parenthood: www.plannedparenthood.org  Office on [employer] [facility]: http://hoffman.com/ Summary  Practicing safe sex means taking steps before and during sex to reduce your risk getting an STI, giving your partner an STI, and having an unwanted or unplanned pregnancy.  Before having sex with a new partner, talk to your partner about past partners, past STIs, and drug use.  Use a condom every time you have vaginal, oral, or anal sex. Both females and males should wear condoms during oral sex.  Check your body regularly for sores, blisters, rashes, or unusual discharge. If you notice any of these problems, visit your health care provider.  See your health care provider for regular screenings, exams, and tests for STIs. This information is not intended to replace advice given to you by your health care provider. Make sure you  discuss any questions you have with your health care provider. Document Revised: 05/30/2019 Document Reviewed: 05/30/2019 Elsevier Patient Education  2021 [employer].

## 2020-05-23 NOTE — Progress Notes (Signed)
    SUBJECTIVE:   CHIEF COMPLAINT / HPI:   Vaginal discharge Ms. Sperry presents to clinic today with roughly 1 month of white, malodorous vaginal discharge.  She denies any itching or abdominal discomfort.  She also denies nausea or vomiting.  She is sexually active.  She has noted some mild discomfort with intercourse.  Abnormal menstrual cycle She reports that she had her menstrual period at the beginning of March and then again at the end of March going into early April.  She has not had a menstrual period since about April 2.  She is sexually active and wanted to know if this abnormality is unusual or concerning.  She is not currently taking any form of contraception.  Her previous contraception has included NuvaRing which was discontinued due to migraines, Nexplanon which failed to change her menstrual cycle and OCPs.  She is interested in discussion about birth control options today.  PERTINENT  PMH / PSH: See HPI  OBJECTIVE:   BP (!) 110/56   Pulse 93   Ht 5\' 3"  (1.6 m)   Wt 143 lb 4 oz (65 kg)   LMP 05/03/2020   SpO2 99%   BMI 25.38 kg/m    General: Well-appearing young woman in no acute distress.  Resting comfortably on the table in the exam room. Respiratory: Breathing comfortably on room air.  No respiratory distress. Extremities: Warm, dry. Pelvic: Normal-appearing external genitalia.  Moist, pink vaginal mucosa with moderate white leukorrhea.  Os visualized.  Samples of discharge taken.  Pap smear performed.  No abnormal discomfort with the pelvic exam.  Chaperone present.  ASSESSMENT/PLAN:   Contraceptive management We discussed various birth control options which included: OCPs, Depo injection, Nexplanon, IUDs.  She did not feel strongly about starting birth control today but was grateful for the conversation.  She reported that she would return to clinic when she felt she was ready for a further discussion and decision about birth control.  Abnormal menstrual  cycle Pregnancy test negative today.  She reports 1 month of menstrual irregularity.  At this time, but does not warrant further work-up.  She was encouraged to return to clinic if she noted persistent irregularities with her menstrual cycle that spanned multiple months.  See the birth control problem for further discussion on that point.  Bacterial vaginosis Physical and wet prep consistent with bacterial vaginosis.  Pregnancy test negative.  We discussed this is a overgrowth of typical vaginal flora. -Metronidazole 500 mg twice daily for 7 days -Follow-up HIV, RPR, GC/chlamydia   Screening for cervical cancer -Follow-up Pap smear  05/05/2020, MD [facility]

## 2020-05-23 NOTE — Assessment & Plan Note (Addendum)
Pregnancy test negative today.  She reports 1 month of menstrual irregularity.  At this time, but does not warrant further work-up.  She was encouraged to return to clinic if she noted persistent irregularities with her menstrual cycle that spanned multiple months.  See the birth control problem for further discussion on that point.

## 2020-05-24 LAB — RPR: RPR Ser Ql: NONREACTIVE

## 2020-05-24 LAB — HIV ANTIBODY (ROUTINE TESTING W REFLEX): HIV Screen 4th Generation wRfx: NONREACTIVE

## 2020-05-28 LAB — CYTOLOGY - PAP
Chlamydia: NEGATIVE
Comment: NEGATIVE
Comment: NORMAL
Neisseria Gonorrhea: NEGATIVE

## 2020-06-13 NOTE — Patient Instructions (Signed)
Patient no-show for appt today. Called patient and left HIPAA compliant VM. 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 She will need pap repeat in 1 year. I recommend she get the HPV vaccines done soon as well as this is the most common cause of cervical cancer and she is at an age where it will help protect her in the future and it will be covered by insurance. Please relay this message if she returns the call.   Shirlean Mylar, MD [facility], PGY-2

## 2020-06-13 NOTE — Progress Notes (Deleted)
Patient no-show for appt today. Called patient and left HIPAA compliant VM. 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 She will need pap repeat in 1 year. I recommend she get the HPV vaccines done soon as well as this is the most common cause of cervical cancer and she is at an age where it will help protect her in the future and it will be covered by insurance. Please relay this message if she returns the call.   Kiptyn Rafuse, MD [facility], PGY-2 

## 2020-06-14 ENCOUNTER — Telehealth: Payer: Self-pay | Admitting: Family Medicine

## 2020-06-14 ENCOUNTER — Ambulatory Visit (INDEPENDENT_AMBULATORY_CARE_PROVIDER_SITE_OTHER): Payer: Medicaid Other | Admitting: Family Medicine

## 2020-06-14 DIAGNOSIS — Z5329 Procedure and treatment not carried out because of patient's decision for other reasons: Secondary | ICD-10-CM

## 2020-06-14 NOTE — Telephone Encounter (Signed)
Patient no-show for appt today. Called and left HIPAA compliant VM. Patient had LSIL result on pap smear on 05/23/20, needs repeat pap in 1 year. Recommend that she get HPV vaccine series if she has not yet done so as HPV is the cause of cervical cancer, these vaccines can protect her in the future, and she is at an age where it will be covered by insurance. Please relay this message if she returns the call.  Shirlean Mylar, MD [facility], PGY-2

## 2020-06-21 NOTE — Progress Notes (Signed)
    SUBJECTIVE:   CHIEF COMPLAINT / HPI:   Abnormal pap: Patient had LSIL result on pap smear on 05/23/20, needs repeat pap in 1 year. Recommend that she get HPV vaccine series if she has not yet done so as HPV is the cause of cervical cancer, these vaccines can protect her in the future, and she is at an age where it will be covered by insurance.  Vaginal odor/discharge: LMP 05/28/20, last intercourse prior to that period. She has had white, malodorous discharge x 1 month. No pruritus or pain, some discomfort with intercourse. Patient completed 7 day course of flagyl 500 mg BID in May after last visit due to BV on wet prep. Will repeat wet prep today. She denies fever, chills, suprapubic or flank pain.  PERTINENT  PMH / PSH: LSIL  OBJECTIVE:   BP 106/73   Pulse 81   Ht 5\' 3"  (1.6 m)   Wt 138 lb 6 oz (62.8 kg)   LMP 05/30/2020   BMI 24.51 kg/m   Nursing note and vitals reviewed GEN: age-appropriate AAW, resting comfortably in chair, NAD, WNWD PELVIC:  Normal appearing external female genitalia, normal vaginal epithelium, scant white discharge, no obvious odor.  Neuro: Alert and at baseline Ext: no edema Psych: Pleasant and appropriate   ASSESSMENT/PLAN:   Vaginal odor Wet prep negative for clue cells, yeast, negative whiff test, however many bacteria present. Patient reports that she has not had intercourse since prior to last GC/CT test on 05/23/20, but will repeat today. - f/u GC/CT  Abnormal Pap smear of cervix Pap on 05/23/20 LGSIL. Patient to have repeat pap in 1 year (2023), discussed with patient. HPV vaccine given today. Upon review of state immunizations, patient had 2 doses of HPV vaccine, but needed 3rd dose (started after age 13). Now HPV vaccine series is complete.     17, MD [facility]

## 2020-06-22 ENCOUNTER — Other Ambulatory Visit (HOSPITAL_COMMUNITY)
Admission: RE | Admit: 2020-06-22 | Discharge: 2020-06-22 | Disposition: A | Discharge status: Home / Self Care | Payer: Medicaid Other | Source: Ambulatory Visit | Attending: Family Medicine | Admitting: Family Medicine

## 2020-06-22 ENCOUNTER — Other Ambulatory Visit: Payer: Self-pay

## 2020-06-22 ENCOUNTER — Encounter: Payer: Self-pay | Admitting: Family Medicine

## 2020-06-22 ENCOUNTER — Ambulatory Visit (INDEPENDENT_AMBULATORY_CARE_PROVIDER_SITE_OTHER): Payer: Medicaid Other | Admitting: Family Medicine

## 2020-06-22 VITALS — BP 106/73 | HR 81 | Ht 63.0 in | Wt 138.4 lb

## 2020-06-22 DIAGNOSIS — Z113 Encounter for screening for infections with a predominantly sexual mode of transmission: Secondary | ICD-10-CM | POA: Insufficient documentation

## 2020-06-22 DIAGNOSIS — R87612 Low grade squamous intraepithelial lesion on cytologic smear of cervix (LGSIL): Secondary | ICD-10-CM

## 2020-06-22 DIAGNOSIS — R87619 Unspecified abnormal cytological findings in specimens from cervix uteri: Secondary | ICD-10-CM | POA: Insufficient documentation

## 2020-06-22 DIAGNOSIS — N898 Other specified noninflammatory disorders of vagina: Secondary | ICD-10-CM | POA: Diagnosis not present

## 2020-06-22 DIAGNOSIS — Z23 Encounter for immunization: Secondary | ICD-10-CM | POA: Diagnosis not present

## 2020-06-22 HISTORY — DX: Other specified noninflammatory disorders of vagina: N89.8

## 2020-06-22 LAB — POCT WET PREP (WET MOUNT)
Clue Cells Wet Prep Whiff POC: NEGATIVE
Trichomonas Wet Prep HPF POC: ABSENT

## 2020-06-22 NOTE — Assessment & Plan Note (Signed)
Wet prep negative for clue cells, yeast, negative whiff test, however many bacteria present. Patient reports that she has not had intercourse since prior to last GC/CT test on 05/23/20, but will repeat today. - f/u GC/CT

## 2020-06-22 NOTE — Patient Instructions (Addendum)
It was a pleasure to see you today!  Pap smear: your next pap smear is due in May 2023. You got the first dose of HPV vaccines today. Your next dose is in 1-2 months (July-August), and your third dose will be in 6 months (December).  2. I did some testing today that will take 1-2 days to return. I will let you know as soon as the results are available. I recommend against douching or using special products, just regular soap and water externally. Also you can eat probiotic yogurt for vaginal balance.   Be Well,  Dr. Leary Roca

## 2020-06-22 NOTE — Assessment & Plan Note (Addendum)
Pap on 05/23/20 LGSIL. Patient to have repeat pap in 1 year (2023), discussed with patient. HPV vaccine given today. Upon review of state immunizations, patient had 2 doses of HPV vaccine, but needed 3rd dose (started after age 20). Now HPV vaccine series is complete.

## 2020-06-26 LAB — CERVICOVAGINAL ANCILLARY ONLY
Chlamydia: NEGATIVE
Comment: NEGATIVE
Comment: NORMAL
Neisseria Gonorrhea: NEGATIVE

## 2020-07-17 NOTE — Progress Notes (Signed)
Patient presented for another HPV vaccine, but messaged her that we looked at [facility] and found that she is UTD after the one shot at last appointment. I had messaged patient on mychart that should could cancel appt. No new symptoms. No need for appointment today, no charge.

## 2020-07-18 ENCOUNTER — Other Ambulatory Visit: Payer: Self-pay

## 2020-07-18 ENCOUNTER — Ambulatory Visit (INDEPENDENT_AMBULATORY_CARE_PROVIDER_SITE_OTHER): Payer: Medicaid Other | Admitting: Family Medicine

## 2020-07-18 DIAGNOSIS — Z23 Encounter for immunization: Secondary | ICD-10-CM

## 2020-08-01 ENCOUNTER — Emergency Department
Admission: EM | Admit: 2020-08-01 | Discharge: 2020-08-01 | Discharge status: Against Medical Advice | Payer: Medicaid Other

## 2020-09-25 ENCOUNTER — Telehealth (INDEPENDENT_AMBULATORY_CARE_PROVIDER_SITE_OTHER): Payer: Medicaid Other | Admitting: Family Medicine

## 2020-09-25 ENCOUNTER — Other Ambulatory Visit: Payer: Self-pay

## 2020-09-25 ENCOUNTER — Telehealth: Payer: Self-pay | Admitting: Family Medicine

## 2020-09-25 DIAGNOSIS — Z91199 Patient's noncompliance with other medical treatment and regimen due to unspecified reason: Secondary | ICD-10-CM

## 2020-09-25 DIAGNOSIS — Z5329 Procedure and treatment not carried out because of patient's decision for other reasons: Secondary | ICD-10-CM

## 2020-09-25 NOTE — Progress Notes (Signed)
No show

## 2020-09-25 NOTE — Telephone Encounter (Signed)
Left HIPAA compliant VM regarding telemedicine visit this morning.

## 2020-10-10 ENCOUNTER — Emergency Department: Payer: Medicaid Other

## 2020-10-10 ENCOUNTER — Other Ambulatory Visit: Payer: Self-pay

## 2020-10-10 ENCOUNTER — Emergency Department
Admission: EM | Admit: 2020-10-10 | Discharge: 2020-10-10 | Disposition: A | Discharge status: Home / Self Care | Payer: Medicaid Other | Attending: Emergency Medicine | Admitting: Emergency Medicine

## 2020-10-10 DIAGNOSIS — R1030 Lower abdominal pain, unspecified: Secondary | ICD-10-CM | POA: Diagnosis not present

## 2020-10-10 DIAGNOSIS — R102 Pelvic and perineal pain: Secondary | ICD-10-CM | POA: Diagnosis not present

## 2020-10-10 DIAGNOSIS — L819 Disorder of pigmentation, unspecified: Secondary | ICD-10-CM

## 2020-10-10 LAB — CBC WITH DIFFERENTIAL/PLATELET
Abs Immature Granulocytes: 0.01 10*3/uL (ref 0.00–0.07)
Basophils Absolute: 0 10*3/uL (ref 0.0–0.1)
Basophils Relative: 1 %
Eosinophils Absolute: 0.1 10*3/uL (ref 0.0–0.5)
Eosinophils Relative: 1 %
HCT: 37.1 % (ref 36.0–46.0)
Hemoglobin: 12.6 g/dL (ref 12.0–15.0)
Immature Granulocytes: 0 %
Lymphocytes Relative: 50 %
Lymphs Abs: 2.3 10*3/uL (ref 0.7–4.0)
MCH: 29.3 pg (ref 26.0–34.0)
MCHC: 34 g/dL (ref 30.0–36.0)
MCV: 86.3 fL (ref 80.0–100.0)
Monocytes Absolute: 0.4 10*3/uL (ref 0.1–1.0)
Monocytes Relative: 9 %
Neutro Abs: 1.8 10*3/uL (ref 1.7–7.7)
Neutrophils Relative %: 39 %
Platelets: 209 10*3/uL (ref 150–400)
RBC: 4.3 MIL/uL (ref 3.87–5.11)
RDW: 14.5 % (ref 11.5–15.5)
WBC: 4.5 10*3/uL (ref 4.0–10.5)
nRBC: 0 % (ref 0.0–0.2)

## 2020-10-10 LAB — COMPREHENSIVE METABOLIC PANEL
ALT: 11 U/L (ref 0–44)
AST: 15 U/L (ref 15–41)
Albumin: 4.4 g/dL (ref 3.5–5.0)
Alkaline Phosphatase: 52 U/L (ref 38–126)
Anion gap: 8 (ref 5–15)
BUN: 6 mg/dL (ref 6–20)
CO2: 25 mmol/L (ref 22–32)
Calcium: 9.7 mg/dL (ref 8.9–10.3)
Chloride: 102 mmol/L (ref 98–111)
Creatinine, Ser: 0.85 mg/dL (ref 0.44–1.00)
GFR, Estimated: >60 mL/min (ref >60)
Glucose, Bld: 89 mg/dL (ref 70–99)
Potassium: 4.1 mmol/L (ref 3.5–5.1)
Sodium: 135 mmol/L (ref 135–145)
Total Bilirubin: 1.5 mg/dL — ABNORMAL HIGH (ref 0.3–1.2)
Total Protein: 8.2 g/dL — ABNORMAL HIGH (ref 6.5–8.1)

## 2020-10-10 LAB — URINALYSIS, ROUTINE W REFLEX MICROSCOPIC
Bilirubin Urine: NEGATIVE
Glucose, UA: NEGATIVE mg/dL
Hgb urine dipstick: NEGATIVE
Ketones, ur: NEGATIVE mg/dL
Nitrite: NEGATIVE
Protein, ur: NEGATIVE mg/dL
Specific Gravity, Urine: 1.02 (ref 1.005–1.030)
pH: 5 (ref 5.0–8.0)

## 2020-10-10 LAB — LIPASE, BLOOD: Lipase: 29 U/L (ref 11–51)

## 2020-10-10 LAB — POC URINE PREG, ED: Preg Test, Ur: NEGATIVE

## 2020-10-10 NOTE — Discharge Instructions (Signed)
You can alternate Tylenol and ibuprofen for discomfort. 

## 2020-10-10 NOTE — ED Triage Notes (Signed)
Pt here with pelvic cramps that started Sunday that are stronger than normal. Pt thinks that it may be another menstruation coming on but she is not sure. Last menstruation was 09/28/20. Pt also c/o of her top lip being darker than normal. Her bottom lip began discoloration about a week ago and her top lip became discolored this morning. Pt in no distress in triage.

## 2020-10-10 NOTE — ED Provider Notes (Signed)
[facility]-EMERGENCY DEPARTMENT  ____________________________________________  Time seen: Approximately 5:55 PM  I have reviewed the triage vital signs and the nursing notes.   HISTORY  Chief Complaint Pelvic Pain   Historian Patient     HPI Linda Weiss is a 20 y.o. female presents to the emergency department with pelvic pain that is bilateral nature.  Patient denies nausea and vomiting.  No fever.  No dysuria or increased urinary frequency.  Patient secondarily concerned that she has some skin color changes of her lips.  Denies new cosmetics and states that she has been staying hydrated at home.  No history of vitiligo.  No chest pain, chest tightness or shortness of breath.   Past Medical History:  Diagnosis Date   Menarche 2012   Age 21, regular every 28 days     Immunizations up to date:  Yes.     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  Prior to Admission medications   Medication Sig Start Date End Date Taking? Authorizing Provider  albuterol (VENTOLIN HFA) 108 (90 Base) MCG/ACT inhaler Inhale 2-4 puffs by mouth every 4 hours as needed for wheezing, cough, and/or shortness of breath 01/03/20   Loleta Rose, MD  fluconazole (DIFLUCAN) 150 MG tablet Take one tablet today followed by second tablet in 72 hours if symptoms persist. 03/25/18   Freddrick March, MD  HYDROcodone-homatropine [facility]) 5-1.5 MG/5ML syrup Take 5 mLs by mouth every 6 (six) hours as needed for cough. 01/03/20    Loleta Rose, MD  metroNIDAZOLE (FLAGYL) 500 MG tablet Take 1 tablet (500 mg total) by mouth 2 (two) times daily. 05/23/20   Mirian Mo, MD  ondansetron (ZOFRAN ODT) 4 MG disintegrating tablet Allow 1-2 tablets to dissolve in your mouth every 8 hours as needed for nausea/vomiting 01/03/20   Loleta Rose, MD  polyethylene glycol powder (GLYCOLAX/MIRALAX) 17 GM/SCOOP powder Take 17 g by mouth in the morning, at noon, in the evening, and at bedtime. 12/20/19   Dana Allan, MD  rizatriptan (MAXALT) 5 MG tablet Take 1 tablet (5 mg total) by mouth once as needed for up to 6 days for migraine. May repeat in 2 hours if needed 01/12/18 01/18/18  Dollene Cleveland, DO  senna (SENOKOT) 8.6 MG TABS tablet Take 1 tablet (8.6 mg total) by mouth in the morning and at bedtime. 12/23/19   Dana Allan, MD    Allergies Hydrocodone and Amoxicillin  No family history on file.  Social History Social History   Tobacco Use   Smoking status: Never   Smokeless tobacco: Never  Substance Use Topics   Alcohol use: No   Drug use: No     Review of Systems  Constitutional: No fever/chills Eyes:  No discharge ENT: No upper respiratory complaints. Respiratory: no cough. No SOB/ use of accessory muscles to breath Gastrointestinal:   No nausea, no vomiting.  No diarrhea.  No constipation. Genitourinary: Patient has pelvic pain.  Musculoskeletal: Negative for musculoskeletal pain. Skin: Negative for rash,  abrasions, lacerations, ecchymosis.    ____________________________________________   PHYSICAL EXAM:  VITAL SIGNS: ED Triage Vitals [10/10/20 1330]  Enc Vitals Group     BP 119/71     Pulse Rate 77     Resp 18     Temp 98.4 F (36.9 C)     Temp Source Oral     SpO2 100 %     Weight 140 lb (63.5 kg)     Height 5\' 3"  (1.6 m)     Head Circumference      Peak Flow      Pain Score 8     Pain Loc      Pain Edu?      Excl. in GC?      Constitutional: Alert and oriented. Well appearing and in  no acute distress. Eyes: Conjunctivae are normal. PERRL. EOMI. Head: Atraumatic. ENT: Cardiovascular: Normal rate, regular rhythm. Normal S1 and S2.  Good peripheral circulation. Respiratory: Normal respiratory effort without tachypnea or retractions. Lungs CTAB. Good air entry to the bases with no decreased or absent breath sounds Gastrointestinal: Bowel sounds x 4 quadrants. Soft and nontender to palpation. No guarding or rigidity. No distention. Musculoskeletal: Full range of motion to all extremities. No obvious deformities noted Neurologic:  Normal for age. No gross focal neurologic deficits are appreciated.  Skin:  Skin is warm, dry and intact. No rash noted. Psychiatric: Mood and affect are normal for age. Speech and behavior are normal.   ____________________________________________   LABS (all labs ordered are listed, but only abnormal results are displayed)  Labs Reviewed  URINALYSIS, ROUTINE W REFLEX MICROSCOPIC - Abnormal; Notable for the following components:      Result Value   Color, Urine YELLOW (*)    APPearance HAZY (*)    Leukocytes,Ua TRACE (*)    Bacteria, UA FEW (*)    All other components within normal limits  COMPREHENSIVE METABOLIC PANEL - Abnormal; Notable for the following components:   Total Protein 8.2 (*)    Total Bilirubin 1.5 (*)    All other components within normal limits  CBC WITH DIFFERENTIAL/PLATELET  LIPASE, BLOOD  POC URINE PREG, ED   ____________________________________________  EKG   ____________________________________________  RADIOLOGY , personally viewed and evaluated these images (plain radiographs) as part of my medical decision making, as well as reviewing the written report by the radiologist.  DG Abdomen 1 View  Result Date: 10/10/2020 CLINICAL DATA:  Lower abdominal pain. pelvic cramps that started Sunday that are stronger than normal. Pt thinks that it may be another menstruation coming on but she is not  sure EXAM: ABDOMEN - 1 VIEW COMPARISON:  12/21/2019. FINDINGS: Normal bowel gas pattern. No evidence of renal or ureteral stones. Several stable phleboliths noted in the pelvis. Soft tissues are otherwise unremarkable. Lung bases are clear. Skeletal structures are unremarkable. IMPRESSION: Negative. Electronically Signed   By: 12/23/2019 M.D.   On: 10/10/2020 15:35     ____________________________________________    PROCEDURES  Procedure(s) performed:     Procedures     Medications - No data to display   ____________________________________________   INITIAL IMPRESSION / ASSESSMENT AND PLAN / ED COURSE  Pertinent labs & imaging results that were available during my care of the patient were reviewed by me and considered in my medical decision making (see chart for details).      Assessment and Plan:  Pelvic pain:  20 year old female presents to the emergency department with pelvic  pain that started this morning.  CBC, CMP and lipase were within reference range.  Urine pregnancy test was negative.  Urinalysis shows no signs of UTI.  Dedicated pelvic ultrasound showed no evidence of torsion or other acute pelvic abnormality.  Mom had questions about discoloration of patient's lips.  No obvious abnormality was visualized on exam but this is difficult to assess as this is the first time I have ever seen patient.  I did recommend that if mom has concerns about skin of lip she should follow-up with dermatology.  ____________________________________________  FINAL CLINICAL IMPRESSION(S) / ED DIAGNOSES  Final diagnoses:  Pelvic pain      NEW MEDICATIONS STARTED DURING THIS VISIT:  ED Discharge Orders     None           This chart was dictated using voice recognition software/Dragon. Despite best efforts to proofread, errors can occur which can change the meaning. Any change was purely unintentional.     Orvil Feil, PA-C 10/10/20 2155    Merwyn Katos, MD 10/16/20 2101

## 2020-10-10 NOTE — ED Provider Notes (Signed)
Emergency Medicine Provider Triage Evaluation Note  Linda Weiss , a 20 y.o. female  was evaluated in triage.  Pt complains of lower abdominal pain.  Patient thought it was menstrual cramps.  She is taken to mag citrate to try and have bowel movement and has not been able to.  Last normal bowel movement was 2 days ago..  Review of Systems  Positive: Lower abdominal pain Negative: Negative for vomiting or diarrhea, chest pain or shortness of breath, fever or chills  Physical Exam  BP 119/71 (BP Location: Left Arm)   Pulse 77   Temp 98.4 F (36.9 C) (Oral)   Resp 18   Ht 5\' 3"  (1.6 m)   Wt 63.5 kg   LMP 09/28/2020   SpO2 100%   BMI 24.80 kg/m  Gen:   Awake, no distress   Resp:  Normal effort  MSK:   Moves extremities without difficulty  Other:  Abdomen is tender to palpation  Medical Decision Making  Medically screening exam initiated at 2:24 PM.  Appropriate orders placed.  Linda Weiss was informed that the remainder of the evaluation will be completed by another provider, this initial triage assessment does not replace that evaluation, and the importance of remaining in the ED until their evaluation is complete.     Robb Matar, PA-C 10/10/20 1435    12/10/20, MD 10/10/20 1524

## 2020-10-10 NOTE — ED Notes (Signed)
See triage note. Pt denies IUD, recent changes in contraception, pain with urination, vaginal discharge. Pt denies chance that she may be pregnant. Pt first noticed change in pigmentation of her upper lip approx 1 week ago. Denies any pain, inflammation. Pt A&Ox4, NAD.

## 2020-10-11 ENCOUNTER — Telehealth: Payer: Self-pay

## 2020-10-11 NOTE — Telephone Encounter (Signed)
Transition Care Management Unsuccessful Follow-up Telephone Call  Date of discharge and from where:  10/10/2020-[facility]  Attempts:  1st Attempt  Reason for unsuccessful TCM follow-up call:  Left voice message    

## 2020-10-12 ENCOUNTER — Ambulatory Visit (INDEPENDENT_AMBULATORY_CARE_PROVIDER_SITE_OTHER): Payer: Medicaid Other | Admitting: Family Medicine

## 2020-10-12 ENCOUNTER — Other Ambulatory Visit: Payer: Self-pay

## 2020-10-12 ENCOUNTER — Other Ambulatory Visit (HOSPITAL_COMMUNITY)
Admission: RE | Admit: 2020-10-12 | Discharge: 2020-10-12 | Disposition: A | Discharge status: Home / Self Care | Payer: Medicaid Other | Source: Ambulatory Visit | Attending: Family Medicine | Admitting: Family Medicine

## 2020-10-12 ENCOUNTER — Encounter: Payer: Self-pay | Admitting: Family Medicine

## 2020-10-12 VITALS — BP 99/63 | HR 79 | Ht 63.0 in | Wt 138.0 lb

## 2020-10-12 DIAGNOSIS — R102 Pelvic and perineal pain unspecified side: Secondary | ICD-10-CM

## 2020-10-12 DIAGNOSIS — R109 Unspecified abdominal pain: Secondary | ICD-10-CM | POA: Diagnosis not present

## 2020-10-12 DIAGNOSIS — N730 Acute parametritis and pelvic cellulitis: Secondary | ICD-10-CM

## 2020-10-12 DIAGNOSIS — R1030 Lower abdominal pain, unspecified: Secondary | ICD-10-CM

## 2020-10-12 DIAGNOSIS — K5904 Chronic idiopathic constipation: Secondary | ICD-10-CM | POA: Diagnosis not present

## 2020-10-12 MED ORDER — POLYETHYLENE GLYCOL 3350 17 GM/SCOOP PO POWD: 17.0000 g | Freq: Four times a day (QID) | ORAL | 6 refills | Status: DC

## 2020-10-12 NOTE — Patient Instructions (Addendum)
It was wonderful to meet you today. Thank you for allowing me to be a part of your care. Below is a short summary of what we discussed at your visit today:  Pelvic / lower abdominal pain Today we took a vaginal swab to test you for gonorrhea, chlamydia, and trichomonas.  These infections commonly cause PID, or pelvic inflammatory disease.  I am thinking this might be the cause of you pelvic pain.  We are also testing for BV and yeast infection just to be complete.  If the results are normal, I will send you a letter or MyChart message. If the results are abnormal, I will give you a call.    Constipation Please let me know if you would like me to refer you to a gastroenterologist (GI doctor) for further work-up and management of your constipation.  If so, the GI office will call you directly to make an appointment.  Health Maintenance We like to think about ways to keep you healthy for years to come. Below are some interventions and screenings we can offer to keep you healthy: - Tdap vaccine -Flu vaccine -COVID-vaccine   Please bring all of your medications to every appointment!  If you have any questions or concerns, please do not hesitate to contact us via phone or MyChart message.   Fayette Pho, MD

## 2020-10-12 NOTE — Telephone Encounter (Signed)
Transition Care Management Unsuccessful Follow-up Telephone Call  Date of discharge and from where:  10/10/2020 from [facility]  Attempts:  2nd Attempt  Reason for unsuccessful TCM follow-up call:  Left voice message    

## 2020-10-12 NOTE — Progress Notes (Signed)
    SUBJECTIVE:   CHIEF COMPLAINT / HPI:   Abdominal pain - onset Sunday - went to [facility] ED 10/5 (visit notes below) - feels like a period cramp, but she normally doesn't get menstrual cramps - feels feverish, nauseous, no objective temp - last BM Wed 10/5 - BM characteristic: green, one drop, very hard - feels different than normal constipation abdominal pain - normally with constipation, feels nauseous, sometimes throws up, gets sweats prior to [facility] - last episode of constipation in December, was prescribed miralax and senna - Miralax is working, but not senna - Still taking miralax once daily - constipation pain usually goes away with BM, but this pain has persisted - not currently sexually active - last sexually active five months ago, was using protection 100% of the time - thinks she previously had STI but cannot recall what it was  [facility] ED 10/10/20 for pelvic pain - CBC, CMP and lipase wnl - U preg negative - UA shows no signs of UTI - KUB unremarkable - Dedicated pelvic ultrasound showed no evidence of torsion or other acute pelvic abnormality.  PERTINENT  PMH / PSH: Chronic lymphatic constipation, acne, dandruff, left foot hammertoe, non-intractable episodic headache  OBJECTIVE:   BP 99/63   Pulse 79   Ht 5\' 3"  (1.6 m)   Wt 138 lb (62.6 kg)   LMP 09/28/2020 Comment: neg preg test  SpO2 98%   BMI 24.45 kg/m    Physical Exam General: Awake, alert, oriented, no acute distress Respiratory: Normal work of breathing, no respiratory distress Neuro: Cranial nerves II through X grossly intact, able to move all extremities spontaneously Vulva: Normal appearing vulva without rashes, lesions, or deformities Vagina: Pale pink rugated vaginal tissue without obvious lesions, thick discharge of whitish color, cervix tender to swab  ASSESSMENT/PLAN:   Abdominal pain in female Five day duration, coinciding with episode of constipation; however, this bilateral lower  abdominal/pelvic pain is inconsistent with her normal constipation-related pain. She presented to [facility] ED on 10/5, where she received a thorough workup that did not reveal the etiology. At [facility], CBC/CMP/lipase all wnl, Upreg negative, UA negative, KUB unremarkable, and pelvic [facility] negative for ovarian torsion or other abnormality. Given her presenting symptoms, the top differential diagnoses include constipation and PID. Constipation most likely given hx chronic episodic idiopathic constipation and only recent BM was on 10/5; however, this pelvic and lower abdominal pain not consistent with her normal constipation discomfort. PID also possible, given pelvic pain, cervical tenderness with swab, and possible history of STI.  - collected vaginal swab for gc/ch, trich, BV, candida - recommend miralax 3-4 times daily, titrated to soft daily bowel movement - referral to GI for workup of chronic idiopathic constipation      [place], MD [facility]

## 2020-10-14 DIAGNOSIS — R109 Unspecified abdominal pain: Secondary | ICD-10-CM | POA: Insufficient documentation

## 2020-10-14 NOTE — Assessment & Plan Note (Signed)
Five day duration, coinciding with episode of constipation; however, this bilateral lower abdominal/pelvic pain is inconsistent with her normal constipation-related pain. She presented to [facility] ED on 10/5, where she received a thorough workup that did not reveal the etiology. At [facility], CBC/CMP/lipase all wnl, Upreg negative, UA negative, KUB unremarkable, and pelvic US negative for ovarian torsion or other abnormality. Given her presenting symptoms, the top differential diagnoses include constipation and PID. Constipation most likely given hx chronic episodic idiopathic constipation and only recent BM was on 10/5; however, this pelvic and lower abdominal pain not consistent with her normal constipation discomfort. PID also possible, given pelvic pain, cervical tenderness with swab, and possible history of STI.  - collected vaginal swab for gc/ch, trich, BV, candida - recommend miralax 3-4 times daily, titrated to soft daily bowel movement - referral to GI for workup of chronic idiopathic constipation

## 2020-10-15 ENCOUNTER — Telehealth: Payer: Self-pay | Admitting: Family Medicine

## 2020-10-15 ENCOUNTER — Other Ambulatory Visit: Payer: Self-pay | Admitting: Family Medicine

## 2020-10-15 DIAGNOSIS — N730 Acute parametritis and pelvic cellulitis: Secondary | ICD-10-CM

## 2020-10-15 LAB — CERVICOVAGINAL ANCILLARY ONLY
Bacterial Vaginitis (gardnerella): NEGATIVE
Candida Glabrata: NEGATIVE
Candida Vaginitis: NEGATIVE
Chlamydia: POSITIVE — AB
Comment: NEGATIVE
Comment: NEGATIVE
Comment: NEGATIVE
Comment: NEGATIVE
Comment: NEGATIVE
Comment: NORMAL
Neisseria Gonorrhea: NEGATIVE
Trichomonas: NEGATIVE

## 2020-10-15 MED ORDER — DOXYCYCLINE HYCLATE 100 MG PO TABS: 100.0000 mg | ORAL_TABLET | Freq: Two times a day (BID) | ORAL | 0 refills | Status: AC

## 2020-10-15 MED ORDER — CEFTRIAXONE SODIUM 1 G IJ SOLR: 500.0000 mg | Freq: Once | INTRAMUSCULAR | 0 refills | Status: AC

## 2020-10-15 MED ORDER — METRONIDAZOLE 500 MG PO TABS: 500.0000 mg | ORAL_TABLET | Freq: Two times a day (BID) | ORAL | 0 refills | Status: AC

## 2020-10-15 NOTE — Addendum Note (Signed)
Addended by: Valetta Close on: 10/15/2020 05:59 PM   Modules accepted: Orders

## 2020-10-15 NOTE — Telephone Encounter (Signed)
**  After Hours/ Emergency Line Call**  Received a call from [facility]. She reports she was returning a call from her doctor.  Through chart review noted that patient tested positive for chlamydia and PCP was not able to get in touch with patient to discuss that medications were sent to pharmacy.  She will also need IM Ceftriaxone.  Patient reports she has not received any medications.  Advised to call clinic tomorrow morning to make appointment in RN clinic for injection.  She can pick up medications at pharmacy and follow up with PCP in 2 weeks. Will forward to PCP.  Dana Allan MD PGY-3, [facility] 10/15/2020 6:32 PM

## 2020-10-15 NOTE — Telephone Encounter (Signed)
Transition Care Management Unsuccessful Follow-up Telephone Call  Date of discharge and from where:  10/10/2020 from [facility]  Attempts:  3rd Attempt  Reason for unsuccessful TCM follow-up call:  Unable to reach patient    

## 2020-10-15 NOTE — Progress Notes (Signed)
Positive chlamydia result. Given clinical presentation, suspect PID as cause for bilateral lower abdominal and pelvic pain along with cervical motion tenderness. This patient will need:  - a single intramuscular dose of a long-acting cephalosporin (ceftriaxone)  - plus doxycycline (100 mg orally twice daily for 14 days)  - plus metronidazole (500 mg orally twice daily for 14 days) - follow up in 2 weeks for TOC  I attempted to call patient but was sent straight to VM. I have sent the PO medications to her pharmacy. I confirmed with her pharmacy that they cannot administer IM ceftriaxone, so she will need to come in for an RN visit to have that administered. I will attempt to call patient again Tuesday.   Of note, her chart does indicate an allergy to amoxicillin of rash. Up to Date has the following information on use of ceftriaxone in this patient:  Patients with a penicillin allergy that was mild and had no features of an IgE-mediated reaction (eg, a maculopapular or morbilliform rash without angioedema, respiratory symptoms, or hypotension) can receive a third-generation cephalosporin (eg, ceftriaxone) normally (algorithm 2). If needed, they can receive a second-generation cephalosporin (eg, cefoxitin or cefotetan) with a test dose procedure.    Fayette Pho, MD

## 2020-10-16 NOTE — Telephone Encounter (Signed)
Called patient. No answer, LVM for patient to return call to office to schedule.   Veronda Prude, RN

## 2020-10-18 ENCOUNTER — Encounter: Payer: Self-pay | Admitting: Family Medicine

## 2020-10-18 NOTE — Telephone Encounter (Signed)
Attempted to reach patient again about coming into clinic for IM ceftriaxone as part of her three-agent treatment for moderate PID. Reached her VM, left HIPAA safe VM informing her she needed to make a nurse-only visit for IM ceftriaxone administration.   Tried to call her pharmacy, [facility] on [address] in [place], [place]. Ph# [phone]. They confirmed the prescriptions for doxycycline and metronidazole were picked up on 10/10.   Fayette Pho, MD

## 2020-10-26 ENCOUNTER — Ambulatory Visit (INDEPENDENT_AMBULATORY_CARE_PROVIDER_SITE_OTHER): Payer: Medicaid Other

## 2020-10-26 ENCOUNTER — Other Ambulatory Visit: Payer: Self-pay

## 2020-10-26 DIAGNOSIS — N730 Acute parametritis and pelvic cellulitis: Secondary | ICD-10-CM

## 2020-10-26 MED ORDER — CEFTRIAXONE SODIUM 250 MG IJ SOLR
250.0000 mg | Freq: Once | INTRAMUSCULAR | Status: AC
  Administered 2020-10-26: 250 mg via INTRAMUSCULAR

## 2020-10-26 NOTE — Progress Notes (Signed)
Patient presents to nurse clinic for treatment of PID. Ceftriaxone 500 mg administered per Dr. Jerolyn Center orders.   Verified with Dr. Leveda Anna that patient did not need to wait additional time due to penicillin reaction. Patient observed for 15 minutes post injection. No adverse reaction noted.   Veronda Prude, RN

## 2020-10-31 NOTE — Progress Notes (Deleted)
    SUBJECTIVE:   CHIEF COMPLAINT / HPI: buttock skin infection   Patient reports she was diagnosed with skin infection in the area of her buttock and now the infection seems to have worsened with increased edema and redness.   PERTINENT  PMH / PSH:  Constipation  Abnormal pap smear   OBJECTIVE:   There were no vitals taken for this visit.  Physical Exam   ASSESSMENT/PLAN:   No problem-specific Assessment & Plan notes found for this encounter.     Ronnald Ramp, MD [facility]

## 2020-11-02 ENCOUNTER — Ambulatory Visit: Payer: Medicaid Other | Admitting: Family Medicine

## 2020-11-06 ENCOUNTER — Encounter: Payer: Self-pay | Admitting: Internal Medicine

## 2020-11-13 ENCOUNTER — Other Ambulatory Visit: Payer: Self-pay

## 2020-11-13 ENCOUNTER — Ambulatory Visit (INDEPENDENT_AMBULATORY_CARE_PROVIDER_SITE_OTHER): Payer: Medicaid Other | Admitting: Family Medicine

## 2020-11-13 ENCOUNTER — Ambulatory Visit (INDEPENDENT_AMBULATORY_CARE_PROVIDER_SITE_OTHER): Payer: Medicaid Other

## 2020-11-13 ENCOUNTER — Encounter: Payer: Self-pay | Admitting: Family Medicine

## 2020-11-13 ENCOUNTER — Other Ambulatory Visit (HOSPITAL_COMMUNITY)
Admission: RE | Admit: 2020-11-13 | Discharge: 2020-11-13 | Disposition: A | Discharge status: Home / Self Care | Payer: Medicaid Other | Source: Ambulatory Visit | Attending: Family Medicine | Admitting: Family Medicine

## 2020-11-13 VITALS — BP 114/64 | HR 70 | Wt 137.6 lb

## 2020-11-13 DIAGNOSIS — Z113 Encounter for screening for infections with a predominantly sexual mode of transmission: Secondary | ICD-10-CM | POA: Insufficient documentation

## 2020-11-13 DIAGNOSIS — Z1159 Encounter for screening for other viral diseases: Secondary | ICD-10-CM

## 2020-11-13 DIAGNOSIS — Z23 Encounter for immunization: Secondary | ICD-10-CM

## 2020-11-13 NOTE — Progress Notes (Signed)
  Date of Visit: 11/13/2020   SUBJECTIVE:   HPI:  Linda Weiss presents today for a same day appointment to discuss STD testing.  Tested positive for chlamydia on 10/7 in setting of pelvic pain, treated for PID with ceftriaxone, doxycycline, and metronidazole. Completed course of all medications. Pelvic pain has since resolved. Has some white discharge which she thinks may be normal. Sexually active with one female partner in the last year. Partner was also treated after patient's diagnosis of chlamydia. Is not on anything for contraception. Desires full STD screening today including labwork. LMP mid-late October.  Sore in mouth - 3 days ago noticed a blister type spot in her mouth on the left side. It was black/dark. Did not hurt. She put some ice on it and it went away. No history of the same in the past  Also noted recently having a sore spot on her anus, wasn't sure if it was a hemorrhoid. Has since resolved. Has upcoming appointment with GI to discuss abdominal pain.  OBJECTIVE:   BP 114/64   Pulse 70   Wt 137 lb 9.6 oz (62.4 kg)   SpO2 100%   BMI 24.37 kg/m  Gen: no acute distress, pleasant, cooperative, well appearing HEENT: normocephalic, atraumatic. No oral lesions seen on exam Lungs: normal work of breathing  GU: normal appearing external genitalia without lesions. Vagina is moist with white discharge. Cervix normal in appearance. No cervical motion tenderness or tenderness on bimanual exam. No adnexal masses. Chaperone present: Linda Weiss, CMA. No anal lesions or hemorrhoids visible.  ASSESSMENT/PLAN:   Health maintenance:  -COVID bivalent booster and Tdap given today -patient declines flu vaccine today -hep C test obtained today  STD screening Update gc/chlamydia/trich, as well as HIV and RPR  Handout given on safe sex Currently no need for contraception as only active with female partner; discussed coming in to review contraception options if becomes active with  female partner  Sore in mouth - resolved, not visible on exam. Monitor, return if recurs  Sore on bottom - may have represented hemorrhoid, not visible on exam today. Monitor, return if recurs for exam.  [place] J. Pollie Meyer, MD [facility]

## 2020-11-13 NOTE — Patient Instructions (Signed)
Safe Sex Practicing safe sex means taking steps before and during sex to reduce your risk of: Getting an STI (sexually transmitted infection). Giving your partner an STI. Unwanted or unplanned pregnancy. How to practice safe sex Ways you can practice safe sex  Limit your sexual partners to only one partner who is having sex with only you. Avoid using alcohol and drugs before having sex. Alcohol and drugs can affect your judgment. Before having sex with a new partner: Talk to your partner about past partners, past STIs, and drug use. Get screened for STIs and discuss the results with your partner. Ask your partner to get screened too. Check your body regularly for sores, blisters, rashes, or unusual discharge. If you notice any of these problems, visit your health care provider. Avoid sexual contact if you have symptoms of an infection or you are being treated for an STI. While having sex, use a condom. Make sure to: Use a condom every time you have vaginal, oral, or anal sex. Both females and males should wear condoms during oral sex. Keep condoms in place from the beginning to the end of sexual activity. Use a latex condom, if possible. Latex condoms offer the best protection. Use only water-based lubricants with a condom. Using petroleum-based lubricants or oils will weaken the condom and increase the chance that it will break. Ways your health care provider can help you practice safe sex  See your health care provider for regular screenings, exams, and tests for STIs. Talk with your health care provider about what kind of birth control (contraception) is best for you. Get vaccinated against hepatitis B and human papillomavirus (HPV). If you are at risk of being infected with HIV (human immunodeficiency virus), talk with your health care provider about taking a prescription medicine to prevent HIV infection. You are at risk for HIV if you: Are a man who has sex with other men. Are  sexually active with more than one partner. Take drugs by injection. Have a sex partner who has HIV. Have unprotected sex. Have sex with someone who has sex with both men and women. Have had an STI. Follow these instructions at home: Take over-the-counter and prescription medicines only as told by your health care provider. Keep all follow-up visits. This is important. Where to find more information Centers for Disease Control and Prevention: www.cdc.gov Planned Parenthood: www.plannedparenthood.org Office on Women's Health: www.womenshealth.gov Summary Practicing safe sex means taking steps before and during sex to reduce your risk getting an STI, giving your partner an STI, and having an unwanted or unplanned pregnancy. Before having sex with a new partner, talk to your partner about past partners, past STIs, and drug use. Use a condom every time you have vaginal, oral, or anal sex. Both females and males should wear condoms during oral sex. Check your body regularly for sores, blisters, rashes, or unusual discharge. If you notice any of these problems, visit your health care provider. See your health care provider for regular screenings, exams, and tests for STIs. This information is not intended to replace advice given to you by your health care provider. Make sure you discuss any questions you have with your health care provider. Document Revised: 05/30/2019 Document Reviewed: 05/30/2019 Elsevier Patient Education  2022 Elsevier Inc.  

## 2020-11-14 LAB — CERVICOVAGINAL ANCILLARY ONLY
Chlamydia: NEGATIVE
Comment: NEGATIVE
Comment: NEGATIVE
Comment: NORMAL
Neisseria Gonorrhea: NEGATIVE
Trichomonas: NEGATIVE

## 2020-11-15 LAB — RPR: RPR Ser Ql: NONREACTIVE

## 2020-11-15 LAB — HCV INTERPRETATION

## 2020-11-15 LAB — HCV AB W REFLEX TO QUANT PCR: HCV Ab: <0.1 s/co ratio (ref 0.0–0.9)

## 2020-11-15 LAB — HIV ANTIBODY (ROUTINE TESTING W REFLEX): HIV Screen 4th Generation wRfx: NONREACTIVE

## 2020-11-27 NOTE — Progress Notes (Signed)
    SUBJECTIVE:   CHIEF COMPLAINT / HPI:   "Pain behind left eye": 20 year old female present with the above.  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 She denies photophobia. She is not sure if she has a change in her vision. She states it her left eye hurts when she moves it but it feels like a pain behind the eye but does not feel like a headache. She states it comes on and off throughout the day. She has tried an ice pack on her eye which helped. She has not tried meds.  She states she has been clenching her jaw a bit more lately but she thinks this is due to the discomfort versus a cause of it.  She does not think she grinds her teeth at night.  She denies any ear pain.  PERTINENT  PMH / PSH: None  OBJECTIVE:   BP 98/60   Pulse 83   Wt 139 lb (63 kg)   LMP 10/28/2020   SpO2 99%   BMI 24.62 kg/m    General: NAD, pleasant, able to participate in exam HEENT: No redness or obvious deformity of the left or right eye, no pain with extraocular movements.  Funduscopic exam was performed with no noted abnormality that was a exam of poor quality due to equipment, nonbulging nonerythematous tympanic membranes, no pain with palpation around either eye.  When doing TMJ testing the patient does not have any clicking and only minor discomfort in the area. Respiratory: No respiratory distress Skin: warm and dry, no rashes noted Psych: Normal affect and mood  ASSESSMENT/PLAN:   Left eye pain: Patient with 4 days of left eye pain which is present behind the eye and seems to be worse with some extraocular movement.  She denies getting anything in the eye, photophobia, or eye drainage.  She does not think she has had any change in her vision.  She thinks this feels different than a headache though the pain is posterior to her eye.  She does admit to gritting her teeth sometimes but she thinks this is due to the discomfort not a cause of it.  On physical exam there is  no obvious injury to the eye, she has no photophobia on funduscopic exam, and has extraocular movements intact with no pain with this.  No signs of any cellulitis.  Auditory canal is clear with no evidence of otitis media.  When doing TMJ testing she has only minor discomfort in the area with no clicking.  Vision testing revealed 20/20 vision bilaterally.  Discussed that she could use a bite guard as TMJ could be a cause of her discomfort, also recommended using Tylenol and ibuprofen as headache may be a cause.  Less concern for ocular etiology with no photophobia, no history of any trauma or possibility of getting something in her eye, and no signs of any cellulitis or other skin abnormality around the eye with no pain with extraocular movement.  Jackelyn Poling, DO [facility]

## 2020-11-28 ENCOUNTER — Ambulatory Visit (INDEPENDENT_AMBULATORY_CARE_PROVIDER_SITE_OTHER): Payer: Medicaid Other | Admitting: Family Medicine

## 2020-11-28 ENCOUNTER — Other Ambulatory Visit: Payer: Self-pay

## 2020-11-28 VITALS — BP 98/60 | HR 83 | Wt 139.0 lb

## 2020-11-28 DIAGNOSIS — H5712 Ocular pain, left eye: Secondary | ICD-10-CM | POA: Diagnosis not present

## 2020-11-28 NOTE — Patient Instructions (Signed)
Today we discussed her left eye pain.  Your left eye pain may be caused by a headache versus TMJ which is irritation at the left back part of your jaw.  For TMJ you can try using a bite guard at night because sometimes grinding your teeth in your sleep can cause this.  You can purchase this over-the-counter.  For the eye pain I recommend using Tylenol and ibuprofen.  If you notice any change in your vision, get worsening eye pain, or have any drainage from your eye or rash around her eye you should return immediately.  Otherwise we can follow-up as needed.  If your eye pain does not improve in the next 1 week I would like to see you back.

## 2020-12-04 ENCOUNTER — Ambulatory Visit: Payer: Medicaid Other | Admitting: Internal Medicine

## 2020-12-06 NOTE — Progress Notes (Deleted)
    SUBJECTIVE:   CHIEF COMPLAINT / HPI: f/u eye  Seen last week for left eye pain behind eye. Funduscopic exam and visual acuity without abnormal findings. Recommended bite guard to treat possible TMJ.  PERTINENT  PMH / PSH: ***  OBJECTIVE:   There were no vitals taken for this visit.  General: ***, NAD CV: RRR, no murmurs*** Pulm: CTAB, no wheezes or rales  ASSESSMENT/PLAN:   No problem-specific Assessment & Plan notes found for this encounter.     Littie Deeds, MD [facility]   {    This will disappear when note is signed, click to select method of visit    :1}

## 2020-12-06 NOTE — Patient Instructions (Incomplete)
It was nice seeing you today! ° ° ° °Please arrive at least 15 minutes prior to your scheduled appointments. ° °Stay well, °Casey Maxfield, MD °[facility] °[phone]  °

## 2020-12-07 ENCOUNTER — Ambulatory Visit: Payer: Medicaid Other | Admitting: Family Medicine

## 2020-12-10 ENCOUNTER — Encounter: Payer: Self-pay | Admitting: Family Medicine

## 2021-01-16 NOTE — Progress Notes (Signed)
° ° °  SUBJECTIVE:   CHIEF COMPLAINT / HPI:   STD Testing Patient presents requesting STD testing. Reports she's had vaginal soreness for the past 2 days. She has also noticed increased urinary frequency and cloudiness of her urine for the past 4-5 days. Denies dysuria or vaginal discharge. She is sexually active and does not use any form of contraception. No known exposure to STIs but she does have a new sexual partner recently. Prior hx of chlamydia in October 2022 with subsequent negative test of cure in November 2022.  Nausea Patient has noticed nausea sensation for the past week. Only occurs first thing in the morning and then resolves. No vomiting, no abdominal pain, no fever, no URI symptoms. States there is no chance she could be pregnant as her LMP was Dec 23rd. Denies dietary changes.   PERTINENT  PMH / PSH: Hx abnormal pap (LSIL in May 2022- needs repeat in 1 yr)  OBJECTIVE:   BP 100/80    Pulse 73    Wt 134 lb (60.8 kg)    SpO2 99%    BMI 23.74 kg/m   General: NAD, pleasant, able to participate in exam Respiratory: No respiratory distress Skin: warm and dry, no rashes noted Psych: Normal affect and mood Neuro: grossly intact GU/GYN: Exam performed in the presence of a chaperone. External genitalia within normal limits.  Vaginal mucosa pink, moist, normal rugae.  Nonfriable cervix without lesions, no discharge or bleeding noted on speculum exam.    ASSESSMENT/PLAN:   Urinary Frequency, Vaginal Soreness Patient with few days of urinary frequency and vaginal soreness. Presents requesting STI testing as she is sexually active and does not use contraception. No abnormality noted on pelvic exam.  -Wet prep negative for BV/yeast/trich -UA negative for infection -Gonorrhea/chlamydia obtained and are pending -HIV/RPR pending as well -Encouraged contraceptive use -Could consider discussing PREP therapy in the future if +STI result   Nausea x1 week, only occurs in the morning and  then resolves spontaneously. No associated vomiting or other symptoms. Etiology unclear. Upreg negative in the office today. Reassurance provided and advised OTC meds such as Tums as needed. Return if symptoms are persistent or worsening.   Maury Dus, MD [facility]

## 2021-01-17 ENCOUNTER — Ambulatory Visit (INDEPENDENT_AMBULATORY_CARE_PROVIDER_SITE_OTHER): Payer: Medicaid Other | Admitting: Family Medicine

## 2021-01-17 ENCOUNTER — Other Ambulatory Visit: Payer: Self-pay

## 2021-01-17 ENCOUNTER — Other Ambulatory Visit (HOSPITAL_COMMUNITY)
Admission: RE | Admit: 2021-01-17 | Discharge: 2021-01-17 | Disposition: A | Discharge status: Home / Self Care | Payer: Medicaid Other | Source: Ambulatory Visit | Attending: Family Medicine | Admitting: Family Medicine

## 2021-01-17 VITALS — BP 100/80 | HR 73 | Ht 63.0 in | Wt 134.0 lb

## 2021-01-17 DIAGNOSIS — R110 Nausea: Secondary | ICD-10-CM | POA: Diagnosis not present

## 2021-01-17 DIAGNOSIS — Z113 Encounter for screening for infections with a predominantly sexual mode of transmission: Secondary | ICD-10-CM | POA: Insufficient documentation

## 2021-01-17 DIAGNOSIS — N898 Other specified noninflammatory disorders of vagina: Secondary | ICD-10-CM

## 2021-01-17 DIAGNOSIS — R350 Frequency of micturition: Secondary | ICD-10-CM

## 2021-01-17 LAB — POCT WET PREP (WET MOUNT)
Clue Cells Wet Prep Whiff POC: NEGATIVE
Trichomonas Wet Prep HPF POC: ABSENT

## 2021-01-17 LAB — POCT URINALYSIS DIP (MANUAL ENTRY)
Blood, UA: NEGATIVE
Glucose, UA: NEGATIVE mg/dL
Leukocytes, UA: NEGATIVE
Nitrite, UA: NEGATIVE
Spec Grav, UA: 1.025 (ref 1.010–1.025)
Urobilinogen, UA: 0.2 E.U./dL
pH, UA: 6 (ref 5.0–8.0)

## 2021-01-17 LAB — POCT URINE PREGNANCY: Preg Test, Ur: NEGATIVE

## 2021-01-17 NOTE — Patient Instructions (Signed)
It was great to meet you!  Today we tested for a urinary tract infection as well as sexually transmitted infections (gonorrhea, chlamydia, trichomonas, HIV and syphilis).  We also checked for yeast and bacterial overgrowth (which are not sexually transmitted) I will send you a MyChart message with the results.  You can try over-the-counter medications such as Tums for your nausea. Try to stay well hydrated and avoid laying down for at least 30 minutes after eating. If your nausea is persistent or worsening, or you start having vomiting, abdominal pain or fevers, please let us know.  Take care,  Dr. [facility]

## 2021-01-18 LAB — CERVICOVAGINAL ANCILLARY ONLY
Chlamydia: NEGATIVE
Comment: NEGATIVE
Comment: NEGATIVE
Comment: NORMAL
Neisseria Gonorrhea: NEGATIVE
Trichomonas: NEGATIVE

## 2021-01-18 LAB — HIV ANTIBODY (ROUTINE TESTING W REFLEX): HIV Screen 4th Generation wRfx: NONREACTIVE

## 2021-01-18 LAB — RPR: RPR Ser Ql: NONREACTIVE

## 2021-02-06 ENCOUNTER — Encounter: Payer: Self-pay | Admitting: Family Medicine

## 2021-02-06 ENCOUNTER — Other Ambulatory Visit: Payer: Self-pay

## 2021-02-06 ENCOUNTER — Ambulatory Visit (INDEPENDENT_AMBULATORY_CARE_PROVIDER_SITE_OTHER): Payer: Medicaid Other | Admitting: Family Medicine

## 2021-02-06 ENCOUNTER — Telehealth: Payer: Self-pay

## 2021-02-06 VITALS — BP 102/60 | HR 71 | Ht 63.0 in | Wt 135.0 lb

## 2021-02-06 DIAGNOSIS — R002 Palpitations: Secondary | ICD-10-CM | POA: Diagnosis not present

## 2021-02-06 NOTE — Progress Notes (Signed)
° ° °  SUBJECTIVE:   CHIEF COMPLAINT / HPI:   Palpitations Yesterday evening patient was sitting down and writing when she felt her heart start racing. Symptoms lasted 10 minutes. Described as beating fast but not skipping a beat or irregular. No SOB, chest pain, dizziness, lightheadedness, nausea, or any other symptoms at the time. This has never happened before. Patient denies feeling stressed or anxious. No caffeine or alcohol use, no substance use. No recent illness.  PERTINENT  PMH / PSH: none  OBJECTIVE:   BP 102/60    Pulse 71    Ht 5\' 3"  (1.6 m)    Wt 135 lb (61.2 kg)    LMP 01/20/2021    SpO2 99%    BMI 23.91 kg/m   Gen: alert, well-appearing, NAD CV: RRR, normal S1/S2 without m/r/g Resp: normal work of breathing, lungs CTAB Neuro: no obvious focal deficits Psych: appropriate mood and affect  ASSESSMENT/PLAN:   Palpitations Patient with single isolated episode of heart-racing sensation which self-resolved after 10 minutes. Given there were no associated symptoms such as dizziness, chest pain, difficulty breathing, etc and patient is young and otherwise healthy, do not feel additional workup is needed at this time. HR 71 in the office today with regular rhythm on exam. Differential includes normal physiologic HR variation vs stress or anxiety vs symptomatic PACs or PVCs vs less likely anemia. -Reassurance provided -Return precautions reviewed -Patient inquiring how to measure her HR at home, discussed use of pulse ox or BP monitor only if she's symptomatic -If recurrent or persistent in the future, could consider Zio patch    01/22/2021, MD [facility]

## 2021-02-06 NOTE — Patient Instructions (Addendum)
It was great to see you!  There are many things that can cause your heart to race-- stress, anxiety, exercise, caffeine, alcohol, etc. I am not worried about anything dangerous or abnormal in your case.  If it happens again, you can always check your heart rate with a home monitor. At rest, I'd expect your heart rate to be between 60-100. Some look like this and they check your heart rate (pulse) and oxygen level   Blood pressure monitors also check your heart rate.   Or if you have a FitBit/Apple Watch or other smart watch.   Let us know if it's becoming more frequent or if you notice dizziness, passing out, chest pain, trouble breathing, or other symptoms.  Take care, Dr. [facility]

## 2021-02-06 NOTE — Telephone Encounter (Signed)
Patient calls nurse line reporting rapid heart rate while at rest. Patient reports she was watching TV yesterday and "out of no where" her heart rate "sped up." Patient reports the episode lasted ~ 15 minutes. Patients denies chest pain, SOB, nausea/vomiting or dizziness at the time. Patient reports she did have an "anxious" feeling, however nothing was "bothering" her. Patient reports normal heart rate ever since.   Patient already scheduled for this afternoon for evaluation.   Red flags discussed with patient in the meantime.

## 2021-02-07 DIAGNOSIS — R002 Palpitations: Secondary | ICD-10-CM | POA: Insufficient documentation

## 2021-02-07 NOTE — Assessment & Plan Note (Signed)
Patient with single isolated episode of heart-racing sensation which self-resolved after 10 minutes. Given there were no associated symptoms such as dizziness, chest pain, difficulty breathing, etc and patient is young and otherwise healthy, do not feel additional workup is needed at this time. HR 71 in the office today with regular rhythm on exam. Differential includes normal physiologic HR variation vs stress or anxiety vs symptomatic PACs or PVCs vs less likely anemia. -Reassurance provided -Return precautions reviewed -Patient inquiring how to measure her HR at home, discussed use of pulse ox or BP monitor only if she's symptomatic -If recurrent or persistent in the future, could consider Zio patch

## 2021-03-05 ENCOUNTER — Ambulatory Visit (INDEPENDENT_AMBULATORY_CARE_PROVIDER_SITE_OTHER): Payer: Medicaid Other | Admitting: Family Medicine

## 2021-03-05 ENCOUNTER — Other Ambulatory Visit (HOSPITAL_COMMUNITY)
Admission: RE | Admit: 2021-03-05 | Discharge: 2021-03-05 | Disposition: A | Discharge status: Home / Self Care | Payer: Medicaid Other | Source: Ambulatory Visit | Attending: Family Medicine | Admitting: Family Medicine

## 2021-03-05 ENCOUNTER — Other Ambulatory Visit: Payer: Self-pay

## 2021-03-05 VITALS — BP 100/62 | HR 74 | Ht 63.0 in | Wt 133.0 lb

## 2021-03-05 DIAGNOSIS — Z113 Encounter for screening for infections with a predominantly sexual mode of transmission: Secondary | ICD-10-CM | POA: Insufficient documentation

## 2021-03-05 HISTORY — DX: Encounter for screening for infections with a predominantly sexual mode of transmission: Z11.3

## 2021-03-05 LAB — POCT WET PREP (WET MOUNT)
Clue Cells Wet Prep Whiff POC: NEGATIVE
Trichomonas Wet Prep HPF POC: ABSENT

## 2021-03-05 NOTE — Patient Instructions (Signed)
It was wonderful to meet you today. Thank you for allowing me to be a part of your care. Below is a short summary of what we discussed at your visit today:  STD testing You requested STD testing today. We swabbed your vagina to test for gonorrhea, chlamydia, trichomonas. For normal results, we will either send you a MyChart message or a letter. If there are any abnormal results, we will call you with the results and a plan going forward.   Health Maintenance We like to think about ways to keep you healthy for years to come. Below are some interventions and screenings we can offer to keep you healthy: - flu vaccine    Please bring all of your medications to every appointment!  If you have any questions or concerns, please do not hesitate to contact us via phone or MyChart message.   Fayette Pho, MD

## 2021-03-05 NOTE — Progress Notes (Signed)
° ° °  SUBJECTIVE:   CHIEF COMPLAINT / HPI:   STI testing - patient presents for STI testing, would like GC/CH, and trich - declines HIV and RPR - she has been experiencing cramping, night time chills, abnormal orange colored discharge - denies fever, rashes, and lesions - history of chlamydia in the past and reports current symptoms feel like past [place] infection  PERTINENT  PMH / PSH:  Patient Active Problem List   Diagnosis Date Noted   Routine screening for STI (sexually transmitted infection) 03/05/2021   Palpitations 02/07/2021   Abdominal pain in female 10/14/2020   Abnormal Pap smear of cervix 06/22/2020   Dandruff 11/09/2017   Eye strain, bilateral 11/09/2017   Nonintractable episodic headache 11/09/2017   Chronic idiopathic constipation 06/03/2016    OBJECTIVE:   BP 100/62    Pulse 74    Ht 5\' 3"  (1.6 m)    Wt 133 lb (60.3 kg)    LMP 02/24/2021    SpO2 99%    BMI 23.56 kg/m   Physical Exam General: Awake, alert, oriented, no acute distress Respiratory: Normal work of breathing, no respiratory distress Neuro: Cranial nerves II through X grossly intact, able to move all extremities spontaneously Vulva: Normal appearing vulva without rashes, lesions, or deformities Vagina: Pale pink rugated vaginal tissue without obvious lesions, thick white discharge, cervix not visualized  ASSESSMENT/PLAN:   Routine screening for STI (sexually transmitted infection) Acute symptoms. Patient believes this is similar presentation to her past chlamydia infection. Vaginal swab collected for GC/CH along with wet prep. Will follow results.      Ezequiel Essex, MD [place]

## 2021-03-05 NOTE — Assessment & Plan Note (Signed)
Acute symptoms. Patient believes this is similar presentation to her past chlamydia infection. Vaginal swab collected for GC/CH along with wet prep. Will follow results.

## 2021-03-06 LAB — CERVICOVAGINAL ANCILLARY ONLY
Chlamydia: NEGATIVE
Comment: NEGATIVE
Comment: NORMAL
Neisseria Gonorrhea: NEGATIVE

## 2021-04-28 ENCOUNTER — Other Ambulatory Visit: Payer: Self-pay

## 2021-04-28 ENCOUNTER — Emergency Department
Admission: EM | Admit: 2021-04-28 | Discharge: 2021-04-28 | Disposition: A | Discharge status: Home / Self Care | Payer: Medicaid Other | Attending: Emergency Medicine | Admitting: Emergency Medicine

## 2021-04-28 ENCOUNTER — Encounter: Payer: Self-pay | Admitting: Emergency Medicine

## 2021-04-28 DIAGNOSIS — L0100 Impetigo, unspecified: Secondary | ICD-10-CM | POA: Diagnosis not present

## 2021-04-28 MED ORDER — IBUPROFEN 600 MG PO TABS
600.0000 mg | ORAL_TABLET | Freq: Once | ORAL | Status: AC
  Administered 2021-04-28: 600 mg via ORAL

## 2021-04-28 MED ORDER — MUPIROCIN 2 % EX OINT: 1.0000 "application " | TOPICAL_OINTMENT | Freq: Two times a day (BID) | CUTANEOUS | 0 refills | Status: AC

## 2021-04-28 MED ORDER — CEPHALEXIN 500 MG PO CAPS: 500.0000 mg | ORAL_CAPSULE | Freq: Three times a day (TID) | ORAL | 0 refills | Status: AC

## 2021-04-28 NOTE — ED Notes (Signed)
Noted scabbing above ear canal. No wax noted in ear. Denies hitting ear or feeling anything fly into ear or craw into ear. No Wax noted in ear. ?

## 2021-04-28 NOTE — Discharge Instructions (Signed)
Apply Mupirocin twice daily for seven days.  Take Keflex three times daily for seven days.  

## 2021-04-28 NOTE — ED Provider Notes (Signed)
? ?[facility] ?Provider Note ? ?Patient Contact: 8:49 PM (approximate) ? ? ?History  ? ?Ear Drainage ? ? ?HPI ? ?Linda Weiss is a 21 y.o. female presents to the emergency department with a 1 cm x 1 cm region of impetigo along the right ear.  Patient states that she has noticed symptoms for the past 2 days.  No fever or chills at home.  No similar symptoms in the past. ? ?  ? ? ?Physical Exam  ? ?Triage Vital Signs: ?ED Triage Vitals  ?Enc Vitals Group  ?   BP 04/28/21 1932 137/86  ?   Pulse Rate 04/28/21 1932 79  ?   Resp 04/28/21 1932 18  ?   Temp 04/28/21 1932 98.1 ?F (36.7 ?C)  ?   Temp Source 04/28/21 1932 Oral  ?   SpO2 04/28/21 1932 100 %  ?   Weight --   ?   Height --   ?   Head Circumference --   ?   Peak Flow --   ?   Pain Score 04/28/21 1934 7  ?   Pain Loc --   ?   Pain Edu? --   ?   Excl. in GC? --   ? ? ?Most recent vital signs: ?Vitals:  ? 04/28/21 1932  ?BP: 137/86  ?Pulse: 79  ?Resp: 18  ?Temp: 98.1 ?F (36.7 ?C)  ?SpO2: 100%  ? ? ? ?General: Alert and in no acute distress. ?Eyes:  PERRL. EOMI. ?Head: No acute traumatic findings ?ENT: ?     Ears: Patient has 1 cm region of impetigo at right tragus.  No lesions or scabbing in the external auditory canal. ?     Nose: No congestion/rhinnorhea. ?     Mouth/Throat: Mucous membranes are moist. ?Neck: No stridor. No cervical spine tenderness to palpation. ?Cardiovascular:  Good peripheral perfusion ?Respiratory: Normal respiratory effort without tachypnea or retractions. Lungs CTAB. Good air entry to the bases with no decreased or absent breath sounds. ?Gastrointestinal: Bowel sounds ?4 quadrants. Soft and nontender to palpation. No guarding or rigidity. No palpable masses. No distention. No CVA tenderness. ?Musculoskeletal: Full range of motion to all extremities.  ?Neurologic:  No gross focal neurologic deficits are appreciated.  ?Skin:   No rash noted ?Other: ? ? ?ED Results / Procedures / Treatments  ? ?Labs ?(all labs  ordered are listed, but only abnormal results are displayed) ?Labs Reviewed - No data to display ? ? ? ? ? ?PROCEDURES: ? ?Critical Care performed: No ? ?Procedures ? ? ?MEDICATIONS ORDERED IN ED: ?Medications  ?ibuprofen (ADVIL) tablet 600 mg (600 mg Oral Given 04/28/21 2026)  ? ? ? ?IMPRESSION / MDM / ASSESSMENT AND PLAN / ED COURSE  ?I reviewed the triage vital signs and the nursing notes. ?             ?               ?Assessment and plan: ?Impetigo:  ? ?Differential diagnosis includes, but is not limited to, impetigo ?21 year old female presents to the emergency department with a 1 cm region of impetigo right tragus.  Patient was discharged with both Keflex and topical mupirocin and advised to follow-up with primary care as needed. ? ?  ? ? ?FINAL CLINICAL IMPRESSION(S) / ED DIAGNOSES  ? ?Final diagnoses:  ?Impetigo  ? ? ? ?Rx / DC Orders  ? ?ED Discharge Orders   ? ?      Ordered  ?  mupirocin ointment (BACTROBAN) 2 %  2 times daily       ? 04/28/21 2017  ?  cephALEXin (KEFLEX) 500 MG capsule  3 times daily       ? 04/28/21 2017  ? ?  ?  ? ?  ? ? ? ?Note:  This document was prepared using Dragon voice recognition software and may include unintentional dictation errors. ?  ?Orvil Feil, PA-C ?04/28/21 2051 ? ?  ?Shaune Pollack, MD ?05/02/21 1044 ? ?

## 2021-04-28 NOTE — ED Triage Notes (Signed)
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  Pt c/o pain as well.  ?

## 2021-04-29 ENCOUNTER — Telehealth: Payer: Self-pay

## 2021-04-29 NOTE — Telephone Encounter (Signed)
Transition Care Management Unsuccessful Follow-up Telephone Call ? ?Date of discharge and from where:  04/28/2021 from [facility] ? ?Attempts:  1st Attempt ? ?Reason for unsuccessful TCM follow-up call:  Left voice message ? ? ? ?

## 2021-04-30 NOTE — Telephone Encounter (Signed)
Transition Care Management Unsuccessful Follow-up Telephone Call ? ?Date of discharge and from where:  04/28/2021-[facility] ? ?Attempts:  2nd Attempt ? ?Reason for unsuccessful TCM follow-up call:  Left voice message ? ?  ?

## 2021-05-01 NOTE — Telephone Encounter (Signed)
Transition Care Management Follow-up Telephone Call ?Date of discharge and from where: 04/28/2021-[facility] ?How have you been since you were released from the hospital? Pt stated she is doing fine.  ?Any questions or concerns? No ? ?Items Reviewed: ?Did the pt receive and understand the discharge instructions provided? Yes  ?Medications obtained and verified? Yes  ?Other? No  ?Any new allergies since your discharge? No  ?Dietary orders reviewed? No ?Do you have support at home? Yes  ? ?Home Care and Equipment/Supplies: ?Were home health services ordered? not applicable ?If so, what is the name of the agency? N/A  ?Has the agency set up a time to come to the patient's home? not applicable ?Were any new equipment or medical supplies ordered?  No ?What is the name of the medical supply agency? N/A ?Were you able to get the supplies/equipment? not applicable ?Do you have any questions related to the use of the equipment or supplies? No ? ?Functional Questionnaire: (I = Independent and D = Dependent) ?ADLs: I ? ?Bathing/Dressing- I ? ?Meal Prep- I ? ?Eating- I ? ?Maintaining continence- I ? ?Transferring/Ambulation- I ? ?Managing Meds- I ? ?Follow up appointments reviewed: ? ?PCP Hospital f/u appt confirmed? No   ?Specialist Hospital f/u appt confirmed? No   ?Are transportation arrangements needed? No  ?If their condition worsens, is the pt aware to call PCP or go to the Emergency Dept.? Yes ?Was the patient provided with contact information for the PCP's office or ED? Yes ?Was to pt encouraged to call back with questions or concerns? Yes  ?

## 2021-05-08 ENCOUNTER — Other Ambulatory Visit (HOSPITAL_COMMUNITY)
Admission: RE | Admit: 2021-05-08 | Discharge: 2021-05-08 | Disposition: A | Discharge status: Home / Self Care | Payer: Medicaid Other | Source: Ambulatory Visit | Attending: Family Medicine | Admitting: Family Medicine

## 2021-05-08 ENCOUNTER — Encounter: Payer: Self-pay | Admitting: Family Medicine

## 2021-05-08 ENCOUNTER — Ambulatory Visit (INDEPENDENT_AMBULATORY_CARE_PROVIDER_SITE_OTHER): Payer: Medicaid Other | Admitting: Family Medicine

## 2021-05-08 VITALS — BP 120/80 | HR 80 | Temp 98.6°F | Ht 63.0 in | Wt 138.6 lb

## 2021-05-08 DIAGNOSIS — R87612 Low grade squamous intraepithelial lesion on cytologic smear of cervix (LGSIL): Secondary | ICD-10-CM | POA: Insufficient documentation

## 2021-05-08 DIAGNOSIS — Z9189 Other specified personal risk factors, not elsewhere classified: Secondary | ICD-10-CM

## 2021-05-08 LAB — POCT WET PREP (WET MOUNT)
Clue Cells Wet Prep Whiff POC: NEGATIVE
Trichomonas Wet Prep HPF POC: ABSENT

## 2021-05-08 NOTE — Patient Instructions (Signed)
It was wonderful to see you today. ? ?Please bring ALL of your medications with you to every visit.  ? ?Today we talked about: ? ?-I will contact you when labs are available ?- I would highly advise routine condom use.  Consider birth control options in the meantime make sure you are taking a folic acid supplement at least 400 mcg daily.  ? ?Please be sure to schedule follow up at the front  desk before you leave today.  ? ?If you haven't already, sign up for My Chart to have easy access to your labs results, and communication with your primary care physician. ? ?Please call the clinic at [phone] if your symptoms worsen or you have any concerns. It was our pleasure to serve you. ? ?Dr. Janus Molder ? ?

## 2021-05-08 NOTE — Progress Notes (Addendum)
    SUBJECTIVE:   CHIEF COMPLAINT / HPI:   Pap Smear Last Pap smear 05/23/2020 with LSIL.  Prior paps: None Contraception: None. Declines birth control at this time.  Sexually Active: Yes Desire for STD Screening: Yes, denies new partner Concerns: None. Having regular periods. LMP 04/28/2021   PERTINENT  PMH / PSH: hx of chlamydia 10/12/2020  OBJECTIVE:   BP 120/80   Pulse 80   Temp 98.6 F (37 C)   Ht 5\' 3"  (1.6 m)   Wt 138 lb 9.6 oz (62.9 kg)   SpO2 99%   BMI 24.55 kg/m    General: Appears well, no acute distress. Age appropriate. Respiratory: normal effort Pelvic exam: normal external genitalia, vulva, vagina, cervix, uterus and adnexa, PAP: Pap smear done today, WET MOUNT done - results: pending, exam chaperoned by . Neuro: alert and oriented Psych: normal affect  ASSESSMENT/PLAN:   1. Low grade squamous intraepithelial lesion on cytologic smear of cervix (LGSIL) - Cytology - PAP([place])  2. At risk for sexually transmitted disease due to unprotected sex Counseled on condom use and folate 400 mcg daily if contraception is not desired. Briefly discussed following up for prep if HIV test negative. She voiced understanding. Consider pregnancy test and throat culture at follow up.  - POCT Wet Prep [facility]) - Cervicovaginal ancillary only - HIV antibody (with reflex) - RPR - Hepatitis C antibody (reflex, frozen specimen)  [facility], DO [facility]

## 2021-05-09 LAB — HCV AB W REFLEX TO QUANT PCR: HCV Ab: NONREACTIVE

## 2021-05-09 LAB — HCV INTERPRETATION

## 2021-05-09 LAB — HIV ANTIBODY (ROUTINE TESTING W REFLEX): HIV Screen 4th Generation wRfx: NONREACTIVE

## 2021-05-09 LAB — RPR: RPR Ser Ql: NONREACTIVE

## 2021-05-10 LAB — CERVICOVAGINAL ANCILLARY ONLY
Chlamydia: NEGATIVE
Comment: NEGATIVE
Comment: NORMAL
Neisseria Gonorrhea: NEGATIVE

## 2021-05-13 LAB — CYTOLOGY - PAP
Adequacy: ABSENT
Diagnosis: NEGATIVE

## 2021-05-15 ENCOUNTER — Ambulatory Visit (INDEPENDENT_AMBULATORY_CARE_PROVIDER_SITE_OTHER): Payer: Medicaid Other | Admitting: Family Medicine

## 2021-05-15 ENCOUNTER — Encounter: Payer: Self-pay | Admitting: Family Medicine

## 2021-05-15 VITALS — BP 107/77 | HR 76 | Temp 98.1°F | Ht 63.0 in | Wt 143.0 lb

## 2021-05-15 DIAGNOSIS — J029 Acute pharyngitis, unspecified: Secondary | ICD-10-CM

## 2021-05-15 DIAGNOSIS — H938X3 Other specified disorders of ear, bilateral: Secondary | ICD-10-CM

## 2021-05-15 DIAGNOSIS — B349 Viral infection, unspecified: Secondary | ICD-10-CM | POA: Diagnosis not present

## 2021-05-15 LAB — POCT RAPID STREP A (OFFICE): Rapid Strep A Screen: NEGATIVE

## 2021-05-15 NOTE — Patient Instructions (Addendum)
It was nice seeing you today! ? ?Take ibuprofen or Tylenol as needed for sore throat. Make sure to drink enough fluids to avoid dehydration. ? ?You can try honey with or without tea for cough. ? ?Covid test and flu tests will result in the next few days. ? ?Stay well, ?Littie Deeds, MD ?[facility] ?[phone] ? ?-- ? ?Make sure to check out at the front desk before you leave today. ? ?Please arrive at least 15 minutes prior to your scheduled appointments. ? ?If you had blood work today, I will send you a MyChart message or a letter if results are normal. Otherwise, I will give you a call. ? ?If you had a referral placed, they will call you to set up an appointment. Please give [place] a call if you don't hear back in the next 2 weeks. ? ?If you need additional refills before your next appointment, please call your pharmacy first.  ?

## 2021-05-15 NOTE — Progress Notes (Signed)
    SUBJECTIVE:   CHIEF COMPLAINT / HPI:  Chief Complaint  Patient presents with   Covid Symptoms    Patient reports feeling ill starting 4 days ago with fever up to 101F. She has not had any further fevers since then but is still having night sweats. Also with sore throat, cough, congestion, rhinorrhea, ear pain. Did have some cervical lymphadenopathy initially which has resolved. Eating a little less due to sore throat. Taking Mucinex and Nyquil at home. Denies diarrhea, vomiting. She is requesting Covid test, flu test, and strep test. No new sexual partners and no concern for STI - was tested last week.  PERTINENT  PMH / PSH: non-contributory  Patient Care Team: Gladys Damme, MD as PCP - General   OBJECTIVE:   BP 107/77   Pulse 76   Temp 98.1 F (36.7 C)   Ht 5\' 3"  (1.6 m)   Wt 143 lb (64.9 kg)   LMP 04/24/2021   SpO2 100%   BMI 25.33 kg/m   Physical Exam Constitutional:      General: She is not in acute distress. HENT:     Head: Normocephalic and atraumatic.     Mouth/Throat:     Mouth: Mucous membranes are moist.     Pharynx: Oropharynx is clear. No oropharyngeal exudate or posterior oropharyngeal erythema.  Eyes:     Pupils: Pupils are equal, round, and reactive to light.  Cardiovascular:     Rate and Rhythm: Normal rate and regular rhythm.  Pulmonary:     Effort: Pulmonary effort is normal. No respiratory distress.     Breath sounds: Normal breath sounds.  Abdominal:     Palpations: Abdomen is soft.     Tenderness: There is no abdominal tenderness.  Musculoskeletal:     Cervical back: Neck supple.  Lymphadenopathy:     Cervical: No cervical adenopathy.  Skin:    General: Skin is warm and dry.  Neurological:     Mental Status: She is alert.        03/05/2021    9:24 AM  Depression screen PHQ 2/9  Decreased Interest 0  Down, Depressed, Hopeless 0  PHQ - 2 Score 0  Altered sleeping 0  Tired, decreased energy 0  Change in appetite 0  Feeling bad  or failure about yourself  0  Trouble concentrating 0  Moving slowly or fidgety/restless 0  Suicidal thoughts 0  PHQ-9 Score 0     {Show previous vital signs (optional):23777}    ASSESSMENT/PLAN:   Viral illness Patient presenting with 1 day of fever associated with viral symptoms. Will test for flu and strep per patient request (not in flu season currently, low likelihood of strep pharyngitis Centor score 1). Strep test negative. - flu/Covid pending  Return if symptoms worsen or fail to improve.   Zola Button, MD [place]

## 2021-05-17 LAB — COVID-19, FLU A+B NAA
Influenza A, NAA: NOT DETECTED
Influenza B, NAA: NOT DETECTED
SARS-CoV-2, NAA: NOT DETECTED

## 2021-06-11 ENCOUNTER — Encounter: Payer: Self-pay | Admitting: *Deleted

## 2021-06-12 ENCOUNTER — Ambulatory Visit (INDEPENDENT_AMBULATORY_CARE_PROVIDER_SITE_OTHER): Payer: Medicaid Other | Admitting: Family Medicine

## 2021-06-12 VITALS — BP 100/60 | HR 85 | Ht 63.0 in | Wt 132.0 lb

## 2021-06-12 DIAGNOSIS — R682 Dry mouth, unspecified: Secondary | ICD-10-CM

## 2021-06-12 MED ORDER — FLUTICASONE PROPIONATE 50 MCG/ACT NA SUSP: 2.0000 | Freq: Every day | NASAL | 1 refills | Status: AC

## 2021-06-12 NOTE — Patient Instructions (Addendum)
I do not think any of your symptoms are coming from your tonsils and your throat overall looks normal.  I think we will start off with trying Flonase nightly and see if that helps anything.  We can also consider using a humidifier in the room and seeing if that helps at all.  I would also recommend continuing your journaling, noting at the end of the day just your general feelings as if you are very stressed or not.  Also noting in the mornings when you have all of the symptoms and seeing if it correlates to the stress level or if there is something else going on.  Things that would make me worried include difficulty with breathing, feeling like your mouth/lip/tongue are swelling and affecting your breathing or swallowing.  If the symptoms do not get any better and/or they start to get worse then please come back sooner or need to consider further evaluation

## 2021-06-12 NOTE — Progress Notes (Signed)
    SUBJECTIVE:   CHIEF COMPLAINT / HPI:   Patient reports that in March she had a dream where her tongue had become really swollen and it woke her from sleep with a headache and dry mouth. She attempted to record herself sleeping and noted a lot of movement by her tongue in her mouth but nothing else. She has been having about 2 episodes per month and have typically been related to very stressful day or stressful dreams/nightmares. Last night she was woken by her brother who told her it sounded like she was choking in her sleep (though it had not woken her up).   Patient does report that most of the episodes happened while she was living with her grandfather, which was a very stressful and inappropriate living situation for her. She was able to move out on 6/1 but was concerned that another episode happened after moving out of that house.   PERTINENT  PMH / PSH: Reviewed  OBJECTIVE:   BP 100/60   Pulse 85   Ht 5\' 3"  (1.6 m)   Wt 132 lb (59.9 kg)   LMP 05/21/2021   SpO2 99%   BMI 23.38 kg/m   General: NAD, well-appearing, well-nourished HEENT: no tonsillar hypertrophy, uvula normal without deviation, equal palate rise bilaterally, full ROM of neck, no cervical lymphadenopathy or swelling. TMs clear bilaterally. Respiratory: No respiratory distress, breathing comfortably, able to speak in full sentences Skin: warm and dry, no rashes noted on exposed skin Psych: Appropriate affect and mood  ASSESSMENT/PLAN:   Dry mouth episodes Patient's symptoms not consistent with sleep apnea, but was considered. At this time, feel like it may be related to stress affecting sleeping pattern/habits which may cause her to have poor sleep and mouth open, which could cause dryness. Did consider angioedema, but patient doesn't report any issues with breathing and no physical swelling in the oropharynx (it would also be idiopathic if it was present). At this time will attempt treatment with conservative  measures and follow-up if persistent or worsening.  - Flonase 2 puffs daily refilled - Keep record of stress levels before bed and determine if related to symptoms - Red flag symptoms of angioedema given - Consider humidifier - Follow-up if worsening or persistent symptoms.    05/23/2021, DO [facility]

## 2021-06-21 ENCOUNTER — Ambulatory Visit (INDEPENDENT_AMBULATORY_CARE_PROVIDER_SITE_OTHER): Payer: Medicaid Other | Admitting: Family Medicine

## 2021-06-21 ENCOUNTER — Other Ambulatory Visit (HOSPITAL_COMMUNITY)
Admission: RE | Admit: 2021-06-21 | Discharge: 2021-06-21 | Disposition: A | Discharge status: Home / Self Care | Payer: Medicaid Other | Source: Ambulatory Visit | Attending: Family Medicine | Admitting: Family Medicine

## 2021-06-21 VITALS — BP 106/75 | HR 73 | Ht 63.0 in | Wt 143.6 lb

## 2021-06-21 DIAGNOSIS — N898 Other specified noninflammatory disorders of vagina: Secondary | ICD-10-CM | POA: Diagnosis not present

## 2021-06-21 LAB — POCT WET PREP (WET MOUNT)
Clue Cells Wet Prep Whiff POC: NEGATIVE
Trichomonas Wet Prep HPF POC: ABSENT
WBC, Wet Prep HPF POC: NONE SEEN

## 2021-06-21 NOTE — Patient Instructions (Addendum)
I will notify you when results are available.  Please call the clinic at [phone] if your symptoms worsen or you have any concerns. It was our pleasure to serve you.  Dr. Salvadore Dom

## 2021-06-21 NOTE — Progress Notes (Unsigned)
    SUBJECTIVE:   CHIEF COMPLAINT / HPI:   Vaginal irritation Last Pap smear 2023 wnl.  Prior paps: LGSIL Patient's last menstrual period was 05/21/2021. Contraception: None. Denies.   Sexually Active: Yes  Desire for STD Screening: Yes, declines blood work  Concerns: vaginal pruritis after using new body wash (method is the company) several days ago.    PERTINENT  PMH / PSH: As above.   OBJECTIVE:   BP 106/75   Pulse 73   Ht 5\' 3"  (1.6 m)   Wt 143 lb 9.6 oz (65.1 kg)   LMP 05/21/2021   SpO2 100%   BMI 25.44 kg/m   General: Appears well, no acute distress. Age appropriate. Respiratory: normal effort Pelvic exam: normal external genitalia, vulva, vagina, cervix, uterus and adnexa, WET MOUNT done - results: negative for pathogens, normal epithelial cells, exam chaperoned by 05/23/2021, CMA. Neuro: alert and oriented Psych: normal affect   ASSESSMENT/PLAN:   Vaginal irritation Several days of pruritis. Will obtain pelvic swabs and determine treatment if needed. Discussed non scented sensitive soaps such as dove for the vaginal area. Also minimizing repeated use of wash clothes or towels that can cause irritation. Can use emollient for pruritis.  - Cervicovaginal ancillary only - POCT Wet Prep (Wet Mount)  Gilberto Better, DO [facility] [place]

## 2021-06-23 LAB — CERVICOVAGINAL ANCILLARY ONLY
Chlamydia: NEGATIVE
Comment: NEGATIVE
Comment: NORMAL
Neisseria Gonorrhea: NEGATIVE

## 2021-08-22 ENCOUNTER — Telehealth: Payer: Self-pay

## 2021-08-22 NOTE — Telephone Encounter (Signed)
Patient calls nurse line requesting to schedule appointment for chest pain and abdominal cramps.   Reports that symptoms started three days ago. Pain is intermittent. Denies strenuous physical activity. Reports that symptoms started when she was lying down in bed. Pain is in the middle of her chest and she describes it as tightness, sharp and stabbing.   Denies SHOB. Denies radiation.   She states that pain has become more frequent and worse today. Recommended that patient be evaluated today in the ED for evaluation of chest pain. Patient is also requesting to schedule follow up with PCP.   Scheduled PCP follow up on Tuesday, 8/22.  Veronda Prude, RN

## 2021-08-27 ENCOUNTER — Ambulatory Visit: Payer: Medicaid Other | Admitting: Family Medicine

## 2021-10-18 ENCOUNTER — Ambulatory Visit (INDEPENDENT_AMBULATORY_CARE_PROVIDER_SITE_OTHER): Payer: Medicaid Other | Admitting: Family Medicine

## 2021-10-18 ENCOUNTER — Other Ambulatory Visit (HOSPITAL_COMMUNITY)
Admission: RE | Admit: 2021-10-18 | Discharge: 2021-10-18 | Disposition: A | Discharge status: Home / Self Care | Payer: Medicaid Other | Source: Ambulatory Visit | Attending: Family Medicine | Admitting: Family Medicine

## 2021-10-18 VITALS — BP 99/65 | HR 77 | Ht 63.0 in | Wt 138.8 lb

## 2021-10-18 DIAGNOSIS — Z23 Encounter for immunization: Secondary | ICD-10-CM | POA: Diagnosis not present

## 2021-10-18 DIAGNOSIS — Z113 Encounter for screening for infections with a predominantly sexual mode of transmission: Secondary | ICD-10-CM | POA: Insufficient documentation

## 2021-10-18 HISTORY — DX: Encounter for immunization: Z23

## 2021-10-18 NOTE — Assessment & Plan Note (Signed)
Tolerated vaccine well without adverse side effects.

## 2021-10-18 NOTE — Patient Instructions (Signed)
It was wonderful to see you today. Thank you for allowing me to be a part of your care. Below is a short summary of what we discussed at your visit today:  STI screening - Today we obtained a vaginal swab to screen for gonorrhea, chlamydia, and trichomonas - We also obtained a blood sample to screen for HIV and syphilis - As above, if the results are normal, I will send you a letter or MyChart message. If the results are abnormal, I will give you a call.     Vaccines Today you received the annual flu vaccine.  You may experience some residual soreness at the injection site.  Gentle stretches and regular use of that arm will help speed up your recovery.  As the vaccines are giving your immune system a "practice run" against specific infections, you may feel a little under the weather for the next several days.  We recommend rest as needed and hydrating.   If you have any questions or concerns, please do not hesitate to contact us via phone or MyChart message.   Ezequiel Essex, MD

## 2021-10-18 NOTE — Progress Notes (Signed)
    SUBJECTIVE:   CHIEF COMPLAINT / HPI:   Linda Weiss is a pleasant 21 yo woman here for a routine STI check.  -Concern for specific exposure? No -Current symptoms: None -Denies abnormal discharge, odor, pain -Contraception: None -Consistent barrier method use: Yes -LMP: 09/22/21 -PAP: UTD 05/08/21 resulted NILM  PERTINENT  PMH / PSH: abnormal PAP smear, chronic idiopathic constipation, abdominal pain, palpitations  OBJECTIVE:   BP 99/65   Pulse 77   Ht 5\' 3"  (1.6 m)   Wt 138 lb 12.8 oz (63 kg)   LMP 09/22/2021   SpO2 100%   BMI 24.59 kg/m    Physical Exam General: Awake, alert, oriented, no acute distress Respiratory: Normal work of breathing, no respiratory distress Neuro: Cranial nerves II through X grossly intact, able to move all extremities spontaneously Vulva: Normal appearing vulva without rashes, lesions, or deformities Vagina: Pale pink rugated vaginal tissue without obvious lesions, physiologic discharge of whitish color, cervix without lesion or overt tenderness with swab  Sensitive exam performed with chaperone in the room:  Linda Weiss CMA   ASSESSMENT/PLAN:   Routine screening for STI (sexually transmitted infection) No concerns for exposure.  Routine screening.  Vaginal swab and blood work obtained to screen for gonorrhea, chlamydia, trichomonas, HIV, and syphilis.  We will follow results.  Flu vaccine need Tolerated vaccine well without adverse side effects.     Ezequiel Essex, MD [place]

## 2021-10-18 NOTE — Assessment & Plan Note (Signed)
No concerns for exposure.  Routine screening.  Vaginal swab and blood work obtained to screen for gonorrhea, chlamydia, trichomonas, HIV, and syphilis.  We will follow results.

## 2021-10-19 LAB — RPR: RPR Ser Ql: NONREACTIVE

## 2021-10-19 LAB — HIV ANTIBODY (ROUTINE TESTING W REFLEX): HIV Screen 4th Generation wRfx: NONREACTIVE

## 2021-10-21 LAB — CERVICOVAGINAL ANCILLARY ONLY
Bacterial Vaginitis (gardnerella): NEGATIVE
Candida Glabrata: NEGATIVE
Candida Vaginitis: POSITIVE — AB
Chlamydia: NEGATIVE
Comment: NEGATIVE
Comment: NEGATIVE
Comment: NEGATIVE
Comment: NEGATIVE
Comment: NEGATIVE
Comment: NORMAL
Neisseria Gonorrhea: NEGATIVE
Trichomonas: NEGATIVE

## 2021-10-23 ENCOUNTER — Ambulatory Visit (INDEPENDENT_AMBULATORY_CARE_PROVIDER_SITE_OTHER): Payer: Medicaid Other | Admitting: Family Medicine

## 2021-10-23 VITALS — BP 101/75 | HR 89 | Temp 98.4°F | Ht 63.0 in | Wt 147.0 lb

## 2021-10-23 DIAGNOSIS — B3731 Acute candidiasis of vulva and vagina: Secondary | ICD-10-CM

## 2021-10-23 MED ORDER — FLUCONAZOLE 150 MG PO TABS: 150.0000 mg | ORAL_TABLET | Freq: Once | ORAL | 0 refills | Status: AC

## 2021-10-23 NOTE — Patient Instructions (Addendum)
It was nice to meet you today!  I think you have a yeast infection.  I sent in medication for you to take.  Please let us know if this is not improving your symptoms.  Be well, Dr. Ardelia Mems   Vaginal Yeast Infection, Adult  Vaginal yeast infection is a condition that causes vaginal discharge as well as soreness, swelling, and redness (inflammation) of the vagina. This is a common condition. Some women get this infection frequently. What are the causes? This condition is caused by a change in the normal balance of the yeast (Candida) and normal bacteria that live in the vagina. This change causes an overgrowth of yeast, which causes the inflammation. What increases the risk? The condition is more likely to develop in women who: Take antibiotic medicines. Have diabetes. Take birth control pills. Are pregnant. Douche often. Have a weak body defense system (immune system). Have been taking steroid medicines for a long time. Frequently wear tight clothing. What are the signs or symptoms? Symptoms of this condition include: White, thick, creamy vaginal discharge. Swelling, itching, redness, and irritation of the vagina. The lips of the vagina (labia) may be affected as well. Pain or a burning feeling while urinating. Pain during sex. How is this diagnosed? This condition is diagnosed based on: Your medical history. A physical exam. A pelvic exam. Your health care provider will examine a sample of your vaginal discharge under a microscope. Your health care provider may send this sample for testing to confirm the diagnosis. How is this treated? This condition is treated with medicine. Medicines may be over-the-counter or prescription. You may be told to use one or more of the following: Medicine that is taken by mouth (orally). Medicine that is applied as a cream (topically). Medicine that is inserted directly into the vagina (suppository). Follow these instructions at home: Take or  apply over-the-counter and prescription medicines only as told by your health care provider. Do not use tampons until your health care provider approves. Do not have sex until your infection has cleared. Sex can prolong or worsen your symptoms of infection. Ask your health care provider when it is safe to resume sexual activity. Keep all follow-up visits. This is important. How is this prevented?  Do not wear tight clothes, such as pantyhose or tight pants. Wear breathable cotton underwear. Do not use douches, perfumed soap, creams, or powders. Wipe from front to back after using the toilet. If you have diabetes, keep your blood sugar levels under control. Ask your health care provider for other ways to prevent yeast infections. Contact a health care provider if: You have a fever. Your symptoms go away and then return. Your symptoms do not get better with treatment. Your symptoms get worse. You have new symptoms. You develop blisters in or around your vagina. You have blood coming from your vagina and it is not your menstrual period. You develop pain in your abdomen. Summary Vaginal yeast infection is a condition that causes discharge as well as soreness, swelling, and redness (inflammation) of the vagina. This condition is treated with medicine. Medicines may be over-the-counter or prescription. Take or apply over-the-counter and prescription medicines only as told by your health care provider. Do not douche. Resume sexual activity or use of tampons as instructed by your health care provider. Contact a health care provider if your symptoms do not get better with treatment or your symptoms go away and then return. This information is not intended to replace advice given to you  by your health care provider. Make sure you discuss any questions you have with your health care provider. Document Revised: 03/12/2020 Document Reviewed: 03/12/2020 Elsevier Patient Education  2023 [employer].

## 2021-10-23 NOTE — Progress Notes (Signed)
  Date of Visit: 10/23/2021   SUBJECTIVE:   HPI:  Linda Weiss presents today for a same day appointment to discuss vaginal itching.  She actually came in 5 days ago for routine STI screening and had testing that was negative for sent in Diflucan for you to take gonorrhea, chlamydia, trichomonas, HIV, and syphilis.  Her vaginal swab did show Candida vaginitis but she was asymptomatic at that time so it was not treated.  Subsequent to that visit about 3 days ago she developed itching and discomfort in her genital area.  Has also noticed thicker discharge with some pinkish/orangeish spots in it.  Reports she should be coming on her period soon.  She is not currently sexually active, last sexually active about 6 months ago.  She would like a pelvic exam today to evaluate the symptoms. Has been scratching vulva where it has been itching.  OBJECTIVE:   BP 101/75   Pulse 89   Temp 98.4 F (36.9 C)   Ht 5\' 3"  (1.6 m)   Wt 147 lb (66.7 kg)   LMP 09/22/2021   SpO2 100%   BMI 26.04 kg/m  Gen:  no acute distress, pleasant, cooperative  Lungs: normal work of breathing  GU: normal external female genitalia with some irritation and excoriations. Vagina is moist with white discharge. Speculum exam not well tolerated so did not insert. Chaperone present: Mercer Pod, CMA   ASSESSMENT/PLAN:   Health maintenance:  -UTD on HM items  Yeast vaginitis History, exam, and prior vaginal swab consistent with yeast vaginitis. Treat with diflucan 150mg  x1, repeat in 3 days if not improved Follow up if not improving.   [place]. Ardelia Mems, [place]

## 2021-10-28 ENCOUNTER — Ambulatory Visit: Payer: Medicaid Other | Admitting: Family Medicine

## 2021-10-28 NOTE — Progress Notes (Deleted)
    SUBJECTIVE:   CHIEF COMPLAINT / HPI:   Vaginal Discharge: Patient is a 21 y.o. female presenting with vaginal discharge for *** days.  She states the discharge is of *** consistency.  She endorses *** vaginal odor.  She is interested in screening for sexually transmitted infections today.  PERTINENT  PMH / PSH: ***None relevant  OBJECTIVE:   LMP 09/22/2021    General: NAD, pleasant, able to participate in exam Respiratory: Normal effort, no obvious respiratory distress Pelvic: VULVA: normal appearing vulva with no masses, tenderness or lesions, VAGINA: Normal appearing vagina with normal color, no lesions, with {GYN VAGINAL DISCHARGE:21986} discharge present, ***CERVIX: No lesions, {GYN VAGINAL DISCHARGE:21986} discharge present,  Chaperone *** present for pelvic exam  ASSESSMENT/PLAN:   No problem-specific Assessment & Plan notes found for this encounter.    Assessment:  21 y.o. female with vaginal discharge for***days, as well as***.  Physical exam significant for*** discharge.  Wet prep performed today shows *** consistent with ***.  Patient is interested in STI screening.   Plan: -Wet prep as above.  Will treat with***. -GC/chlamydia pending -Will check HIV, Hep B and RPR -Contraception: ***. Discussed importance of condoms in protecting against STI. Offered condoms.  -Pap smear UTD, next due ***   Sharion Settler, [place]

## 2021-10-28 NOTE — Patient Instructions (Incomplete)
It was wonderful to see you today.  Please bring ALL of your medications with you to every visit.   Today we talked about:  -We are checking for sexually transmitted infections including chlamydia, gonorrhea, trichomonas, HIV, syphilis and hepatitis B. I will let you know of the results via MyChart or telephone call. We are also checking for bacterial vaginosis and yeast, which are not sexually transmitted infections.  -You should abstain from sexual activity until we have the results. If your test is positive for a sexually transmitted infection, it is important that both you and your partner are both treated.  -It is always important to use barrier protection, such as condoms, to help prevent sexually transmitted infections.     Thank you for choosing [facility].   Please call [phone] with any questions about today's appointment.  Please be sure to schedule follow up at the front  desk before you leave today.   Sharion Settler, DO PGY-3 Family Medicine

## 2021-10-29 ENCOUNTER — Other Ambulatory Visit (HOSPITAL_COMMUNITY)
Admission: RE | Admit: 2021-10-29 | Discharge: 2021-10-29 | Disposition: A | Discharge status: Home / Self Care | Payer: Medicaid Other | Source: Ambulatory Visit | Attending: Family Medicine | Admitting: Family Medicine

## 2021-10-29 ENCOUNTER — Encounter: Payer: Self-pay | Admitting: Family Medicine

## 2021-10-29 ENCOUNTER — Ambulatory Visit (INDEPENDENT_AMBULATORY_CARE_PROVIDER_SITE_OTHER): Payer: Medicaid Other | Admitting: Family Medicine

## 2021-10-29 VITALS — BP 102/68 | HR 80 | Ht 63.0 in | Wt 137.6 lb

## 2021-10-29 DIAGNOSIS — N898 Other specified noninflammatory disorders of vagina: Secondary | ICD-10-CM | POA: Insufficient documentation

## 2021-10-29 MED ORDER — METRONIDAZOLE 500 MG PO TABS: 500.0000 mg | ORAL_TABLET | Freq: Two times a day (BID) | ORAL | 0 refills | Status: AC

## 2021-10-29 NOTE — Patient Instructions (Signed)
It was wonderful to see you today. Thank you for allowing me to be a part of your care. Below is a short summary of what we discussed at your visit today:  Vaginal complaint Today we started metronidazole 5 mg twice daily for 7 days.  This is for presumed BV.  Vaginal swab results should come back in a couple of days.  If the swab comes back negative for BV, we will simply stop metronidazole.  We also collected a separate swab to look for multiple different species of yeast that could be causing your recurrent yeast infections. If the results are normal, I will send you a letter or MyChart message. If the results are abnormal, I will give you a call.     Please bring all of your medications to every appointment!  If you have any questions or concerns, please do not hesitate to contact us via phone or MyChart message.   Ezequiel Essex, MD

## 2021-10-29 NOTE — Progress Notes (Signed)
    SUBJECTIVE:   CHIEF COMPLAINT / HPI:   Vaginal complaint Linda Weiss presents with continued abnormal vaginal discharge and odor. Most recently treated for candidal infection 10/18/21, although reports multiple yeast infections recently. Her symptoms of vaginal pruritis and thick discharge resolved with fluconazole, but now she is experiencing a copious light-colored watery vaginal discharge and abnormal odor. Is interested in extended panel candidal testing.   Denies pruritis, vaginal discomfort, rashes or lesions, lower abdominal pain, and dysuria.  Last STI check 10/18/21 negative for GC, CH, trich, HIV, and RPR. No concerns for new recent exposure.  PERTINENT  PMH / PSH:  Patient Active Problem List   Diagnosis Date Noted   Palpitations 02/07/2021   Abdominal pain in female 10/14/2020   Vaginal discharge 06/22/2020   Abnormal Pap smear of cervix 06/22/2020   Dandruff 11/09/2017   Eye strain, bilateral 11/09/2017   Nonintractable episodic headache 11/09/2017   Chronic idiopathic constipation 06/03/2016    OBJECTIVE:   BP 102/68   Pulse 80   Ht 5\' 3"  (1.6 m)   Wt 137 lb 9.6 oz (62.4 kg)   LMP 10/24/2021   SpO2 99%   BMI 24.37 kg/m    Physical Exam General: Awake, alert, oriented, no acute distress Respiratory: Normal work of breathing, no respiratory distress Neuro: Cranial nerves II through X grossly intact, able to move all extremities spontaneously Vulva: Normal appearing vulva without rashes, lesions, or deformities Vagina: Pale pink rugated vaginal tissue without obvious lesions, copious gray-white discharge, cervix without lesion or overt tenderness with swab  Sensitive exam performed with chaperone in the room:  Linda Weiss, CMA  ASSESSMENT/PLAN:   Vaginal discharge Discharge and odor clinically consistent with BV. Will collect wet prep. Patient elects to start BV treatment with PO metronidazole while awaiting results of send-out labs. Will follow; if  swab negative for BV, will stop metronidazole.  Patient also desires collection of swab for Candida 6 species testing given recurrent yeast infections. Discussed low likelihood of positive result given lack of candida-like symptoms at this time s/p fluconazole 10/13, patient amenable to proceed.      Ezequiel Essex, MD [place]

## 2021-10-30 ENCOUNTER — Telehealth: Payer: Self-pay

## 2021-10-30 ENCOUNTER — Encounter: Payer: Self-pay | Admitting: Family Medicine

## 2021-10-30 ENCOUNTER — Other Ambulatory Visit: Payer: Self-pay | Admitting: Family Medicine

## 2021-10-30 DIAGNOSIS — N898 Other specified noninflammatory disorders of vagina: Secondary | ICD-10-CM | POA: Diagnosis not present

## 2021-10-30 NOTE — Telephone Encounter (Signed)
Received phone call from [facility] lab regarding specimens. They received two swabs, however, only received one order.   [facility] reports that they are unable to do testing for Candida 6 species. Unsure if this was supposed to be sent to [place] or how we need to proceed.   Will forward to [place]. [facility] said that they can hold the sample until they here back from our office.   Please return call to [place] at [phone].  Talbot Grumbling, RN

## 2021-10-30 NOTE — Assessment & Plan Note (Addendum)
Discharge and odor clinically consistent with BV. Will collect wet prep. Patient elects to start BV treatment with PO metronidazole while awaiting results of send-out labs. Will follow; if swab negative for BV, will stop metronidazole.  Patient also desires collection of swab for Candida 6 species testing given recurrent yeast infections. Discussed low likelihood of positive result given lack of candida-like symptoms at this time s/p fluconazole 10/13, patient amenable to proceed.

## 2021-10-31 ENCOUNTER — Encounter: Payer: Self-pay | Admitting: Family Medicine

## 2021-11-01 LAB — CERVICOVAGINAL ANCILLARY ONLY
Bacterial Vaginitis (gardnerella): POSITIVE — AB
Candida Glabrata: NEGATIVE
Candida Vaginitis: NEGATIVE
Comment: NEGATIVE
Comment: NEGATIVE
Comment: NEGATIVE
Comment: NEGATIVE
Trichomonas: NEGATIVE

## 2021-11-03 LAB — CANDIDA 6 SPECIES PROFILE, NAA
C PARAPSILOSIS/TROPICALIS: NEGATIVE
Candida albicans, NAA: NEGATIVE
Candida glabrata, NAA: NEGATIVE
Candida krusei, NAA: NEGATIVE
Candida lusitaniae, NAA: NEGATIVE

## 2021-12-26 ENCOUNTER — Encounter: Payer: Self-pay | Admitting: Family Medicine

## 2021-12-26 ENCOUNTER — Ambulatory Visit (INDEPENDENT_AMBULATORY_CARE_PROVIDER_SITE_OTHER): Payer: Medicaid Other | Admitting: Family Medicine

## 2021-12-26 VITALS — BP 100/69 | HR 80 | Ht 63.0 in | Wt 138.4 lb

## 2021-12-26 DIAGNOSIS — N644 Mastodynia: Secondary | ICD-10-CM

## 2021-12-26 DIAGNOSIS — M7918 Myalgia, other site: Secondary | ICD-10-CM

## 2021-12-26 NOTE — Progress Notes (Signed)
    SUBJECTIVE:   CHIEF COMPLAINT / HPI:   Left breast - Pain on the medial/under side of the breast - Sometimes worse with deep breaths and with laughing - Has been intermittently occurring for the last 10 months - She thought that it was heartburn and took OTC medications without improvement - No history of lung disease or clots - Not associated with periods  Left buttock strain - Occurs if she pushes off with her left leg when trying to get up - Started at Thanksgiving - Some stretches but is not doing formal exercises - Pain doesn't stop her from walking - Improves once she is walking  - Doesn't always occur  PERTINENT  PMH / PSH: Reviewed  OBJECTIVE:   BP 100/69   Pulse 80   Ht 5\' 3"  (1.6 m)   Wt 138 lb 6.4 oz (62.8 kg)   LMP 12/18/2021   SpO2 100%   BMI 24.52 kg/m   General: NAD, well-appearing, well-nourished Respiratory: No respiratory distress, breathing comfortably, able to speak in full sentences Skin: warm and dry, no rashes noted on exposed skin Psych: Appropriate affect and mood MSK: Left breast without masses or tenderness present upon palpation, no axillary mass or tenderness.  Left gluteal muscles and piriformis palpated without pain. Negative FABER and FADIR, negative straight leg raise.Unable to elicit pain upon standing motions  ASSESSMENT/PLAN:   Left breast pain Seems more MSK related, though not consistent with costochondritis. Consideration for precordial catch syndrome. Given patient's high level of concern and the region described as being pain in the breast, will obtain an 12/20/2021 to rule out pathologic cause. - Left breast ultrasound  - Can do NSAIDs if worsening  Left leg strain Seems consistent with hamstring strain at this time. Patient is doing some stretches without exercise. No red flags on exam.  - Hamstring rehab exercises given - Tylenol and Ibuprofen as needed - If no improvement in 4-6 weeks or worsening, can consider PT   [place], DO [facility]

## 2021-12-26 NOTE — Patient Instructions (Signed)
For your left breast pain - We continue to ultrasound the region - It could be something like precordial catch syndrome - Since it goes away so fast there is not really a medication that we will be able to do to help that  Left buttock/hamstring strain - I would recommend doing stretches for the next couple of weeks - If you are able to increase your walking that would likely help - If you are not noticing any improvement over the next 4 weeks we can consider formal physical therapy if you are concerned about the region - You can always treat with Tylenol or ibuprofen if it becomes more persistent

## 2022-01-08 ENCOUNTER — Other Ambulatory Visit: Payer: Medicaid Other

## 2022-01-10 ENCOUNTER — Ambulatory Visit
Admission: RE | Admit: 2022-01-10 | Discharge: 2022-01-10 | Disposition: A | Discharge status: Home / Self Care | Payer: Medicaid Other | Source: Ambulatory Visit | Attending: Family Medicine | Admitting: Family Medicine

## 2022-01-10 DIAGNOSIS — N6489 Other specified disorders of breast: Secondary | ICD-10-CM | POA: Diagnosis not present

## 2022-01-10 DIAGNOSIS — N644 Mastodynia: Secondary | ICD-10-CM

## 2022-02-13 ENCOUNTER — Other Ambulatory Visit: Payer: Self-pay

## 2022-02-13 ENCOUNTER — Ambulatory Visit (INDEPENDENT_AMBULATORY_CARE_PROVIDER_SITE_OTHER): Payer: Medicaid Other | Admitting: Family Medicine

## 2022-02-13 VITALS — BP 98/61 | HR 72 | Ht 63.0 in | Wt 144.4 lb

## 2022-02-13 DIAGNOSIS — H9222 Otorrhagia, left ear: Secondary | ICD-10-CM | POA: Diagnosis not present

## 2022-02-13 DIAGNOSIS — H9201 Otalgia, right ear: Secondary | ICD-10-CM | POA: Diagnosis not present

## 2022-02-13 NOTE — Progress Notes (Addendum)
    SUBJECTIVE:   CHIEF COMPLAINT / HPI:   Linda Weiss is a 22 y.o. female who presents to the [facility] clinic today to discuss the following concerns:   Ear Concerns Noticed that her left ear started bleeding this morning. She was laying down and she felt a wetness to the side of her face, she put a Q-tip in her ear and saw blood. States that it covered 2 Q-tips and there was hardly any blood on the 3rd Q-tip. Not painful.  She does endorse some discomfort to her right ear, feels like there is a bump or cut.   She normally cleans ears out with Q-tips about every other day. Does not wear in ear headphones. No trauma. No difficulty hearing. No popping of ears.   PERTINENT  PMH / PSH: N/A  OBJECTIVE:   BP 98/61   Pulse 72   Ht 5\' 3"  (1.6 m)   Wt 144 lb 6.4 oz (65.5 kg)   SpO2 100%   BMI 25.58 kg/m    General: NAD, pleasant, able to participate in exam HEENT: Normal TM bilaterally. Dried blood appreciated to inferior aspect of left ear cartilaginous canal. No ttp on palpation of tragus or with pulling on ear bilaterally. Multiple ear piercing's present b/l ears. Respiratory: normal effort Psych: Normal affect and mood    ASSESSMENT/PLAN:   1. Ear bleeding, left Without any active bleeding. Unclear source of blood. No cuts appreciated. TM clear. No trauma or new piercing's. Appears to have self-resolved. -Avoid picking at ear, using Q-tips -Return for persistent or worsening symptoms  2. Otalgia of right ear Acute onset x1 day without associated symptoms. TM clear. May be caused by small clogged sweat gland vs small comedone but unable to visualize a clear cause.  -Monitor sx     [place]

## 2022-02-13 NOTE — Patient Instructions (Addendum)
It was wonderful to see you today.  Today we talked about:  I am not sure what caused the bleeding but there are no signs of infection. I think we can just monitor this for now. Avoid picking at your ear or using Q-tips.  You can Debrox drops for ear wax if you would like.   Return if your pain worsens or you develop difficulties hearing or any other concerns.  Thank you for coming to your visit as scheduled. We have had a large "no-show" problem lately, and this significantly limits our ability to see and care for patients. As a friendly reminder- if you cannot make your appointment please call to cancel. We do have a no show policy for those who do not cancel within 24 hours. Our policy is that if you miss or fail to cancel an appointment within 24 hours, 3 times in a 57-month period, you may be dismissed from our clinic.   Thank you for choosing [facility].   Please call [phone] with any questions about today's appointment.  Please be sure to schedule follow up at the front  desk before you leave today.   Sharion Settler, DO PGY-3 Family Medicine

## 2022-04-23 IMAGING — US US PELVIS COMPLETE TRANSABD/TRANSVAG W DUPLEX
1 series · 13 of 25 positions shown · non-contrast
Comparison: None.

CLINICAL DATA: Acute pelvic pain.

EXAM:
TRANSABDOMINAL AND TRANSVAGINAL ULTRASOUND OF PELVIS
DOPPLER ULTRASOUND OF OVARIES
TECHNIQUE: Both transabdominal and transvaginal ultrasound examinations of the
pelvis were performed. Transabdominal technique was performed for
global imaging of the pelvis including uterus, ovaries, adnexal
regions, and pelvic cul-de-sac.
It was necessary to proceed with endovaginal exam following the
transabdominal exam to visualize the endometrium and ovaries. Color
and duplex Doppler ultrasound was utilized to evaluate blood flow to
the ovaries.

[Series 1: us pelvic complete w transvaginal and torsion righ · 13 of 81 slices shown]
[im 1/81]
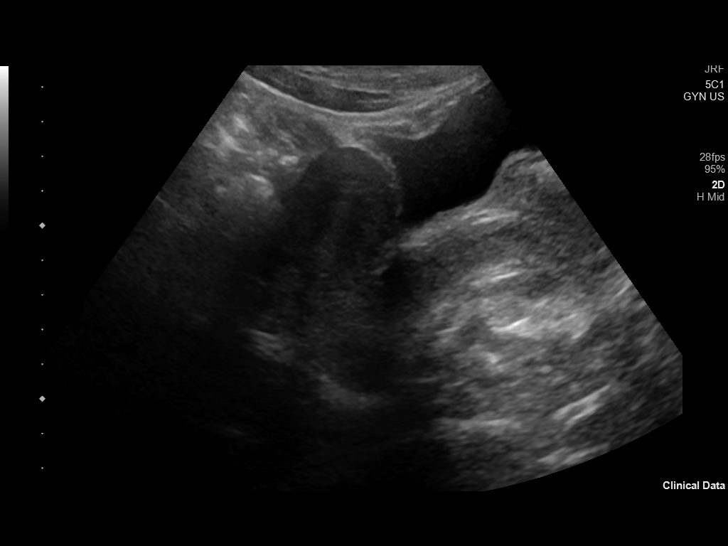
[im 7/81]
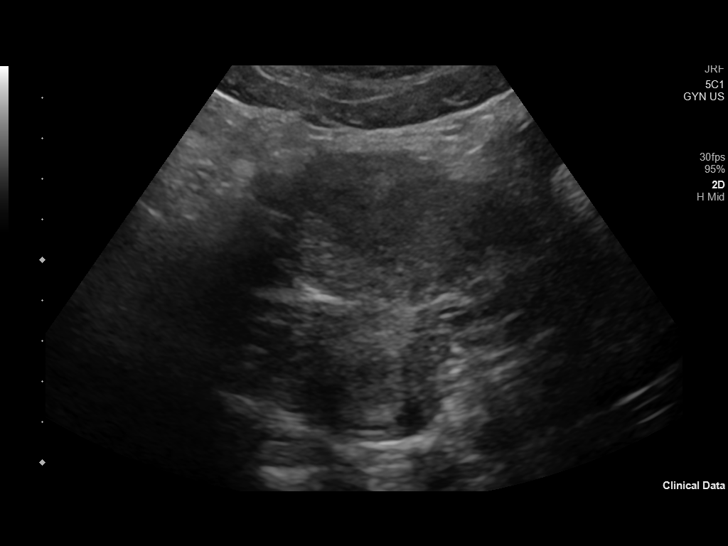
[im 14/81]
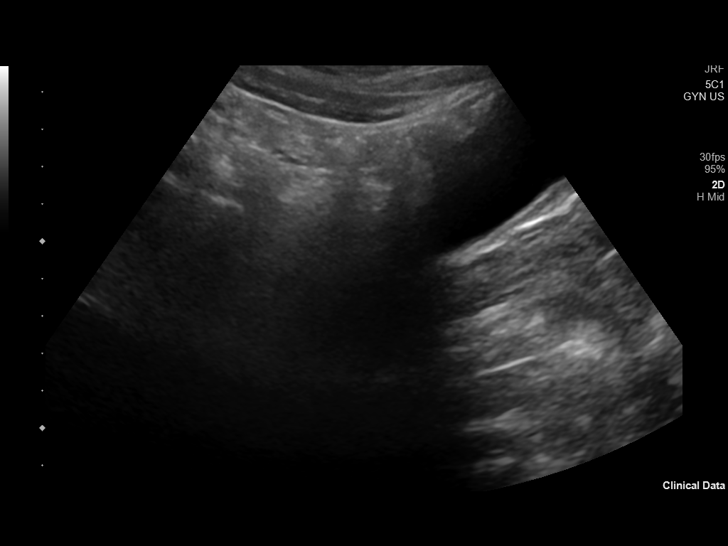
[im 21/81]
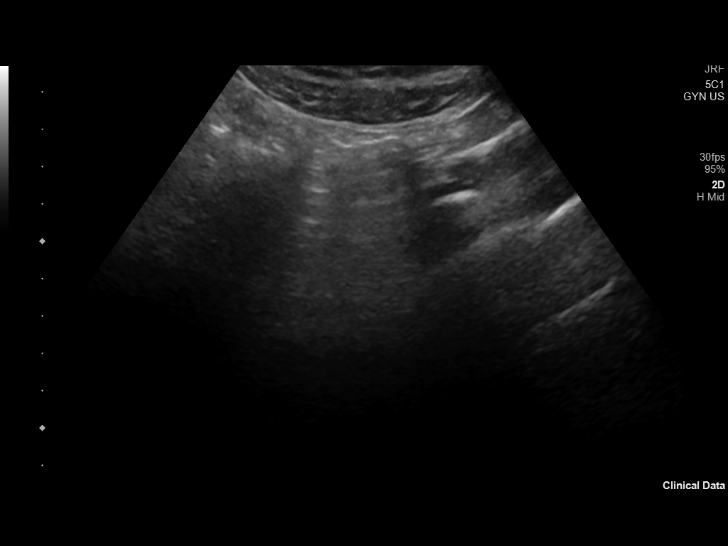
[im 27/81]
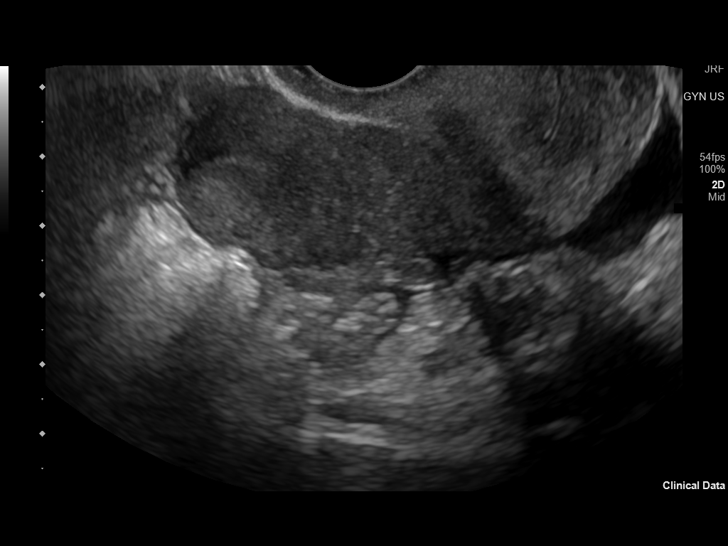
[im 34/81]
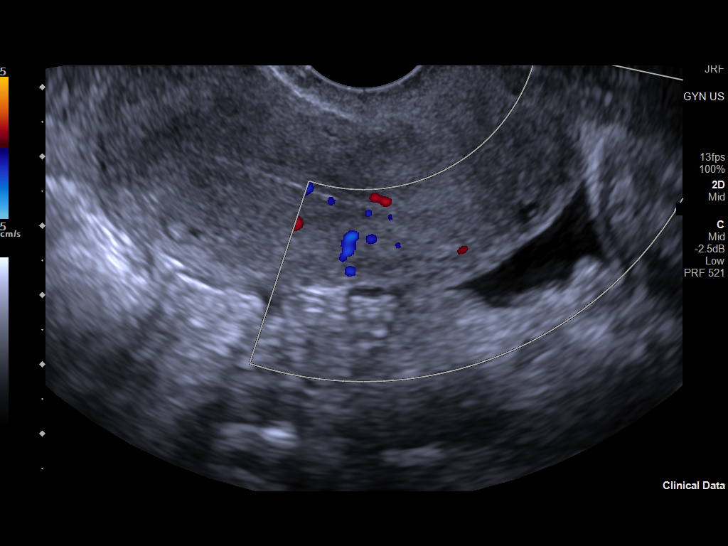
[im 41/81]
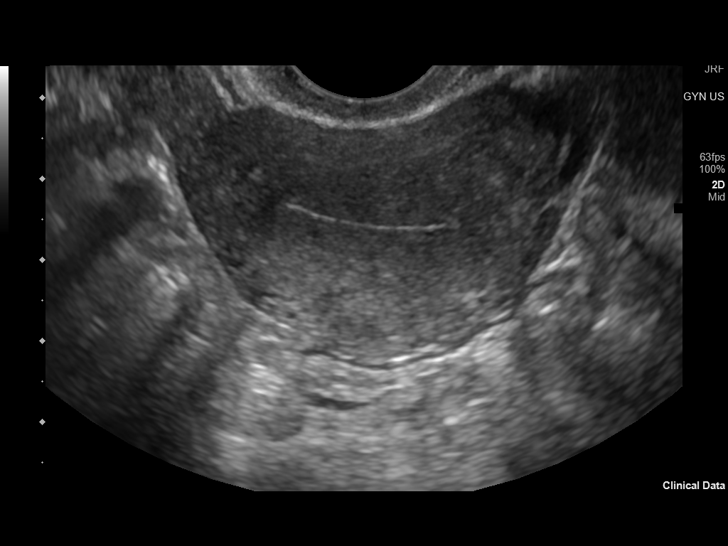
[im 47/81]
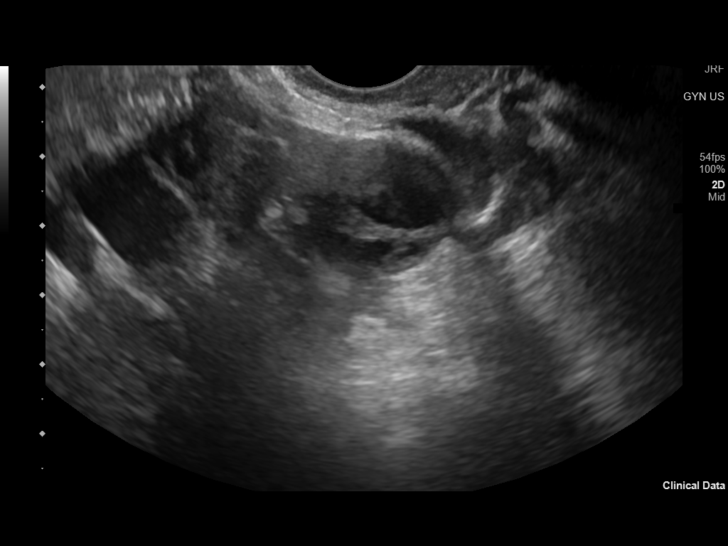
[im 54/81]
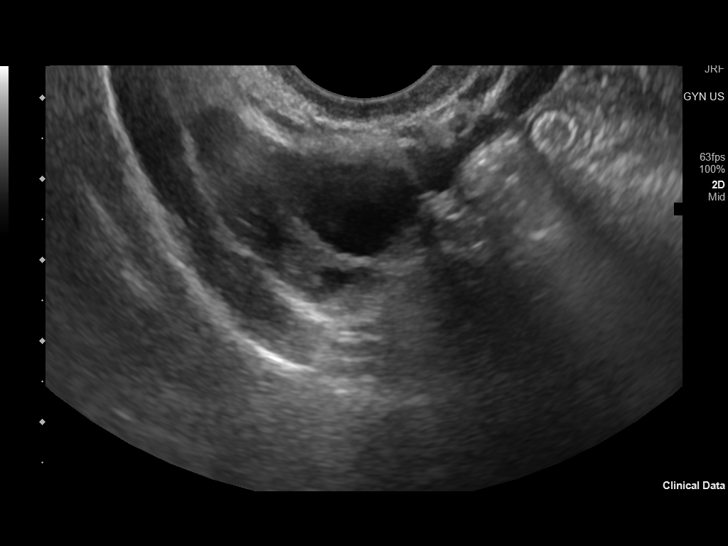
[im 61/81]
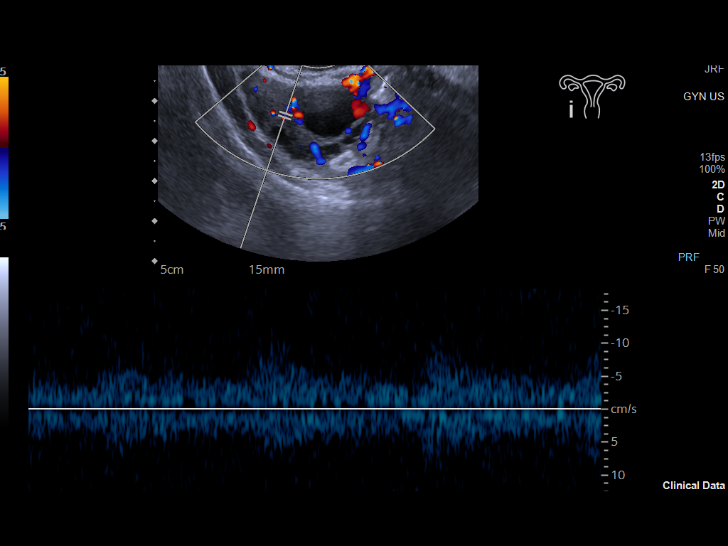
[im 67/81]
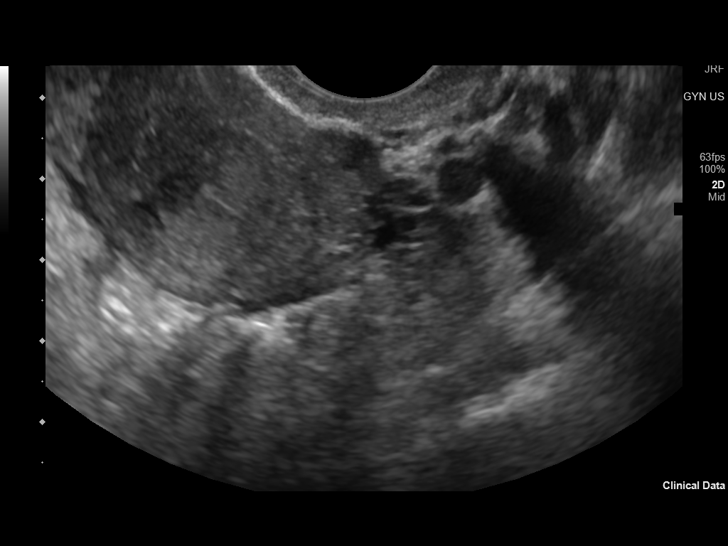
[im 74/81]
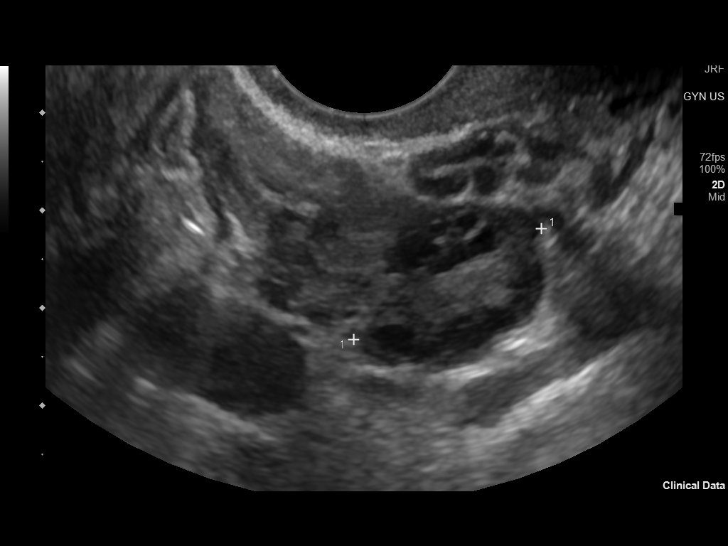
[im 81/81]
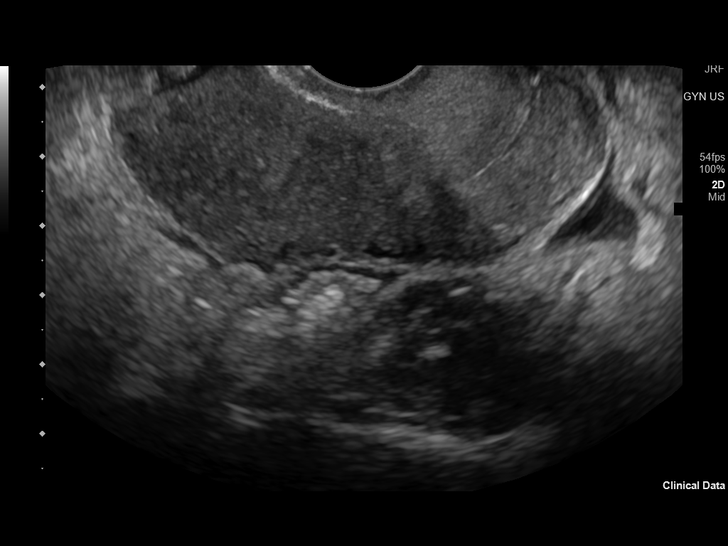

[13 of 25 positions shown; findings below may reference images not displayed]

FINDINGS: Uterus

Measurements: 7.0 x 4.8 x 3.2 cm = volume: 57 mL. No fibroids or
other mass visualized.

Endometrium

Thickness: 3 mm which is within normal limits. No focal abnormality
visualized.

Right ovary

Measurements: 3.5 x 2.5 x 2.1 cm = volume: 9 mL. Normal
appearance/no adnexal mass.

Left ovary

Measurements: 3.0 x 1.5 x 2.2 cm = volume: 5 mL. Normal
appearance/no adnexal mass.

Pulsed Doppler evaluation of both ovaries demonstrates normal
low-resistance arterial and venous waveforms.

Other findings

Small amount of free fluid is noted which most likely is
physiologic.
IMPRESSION: No evidence of ovarian mass or torsion. No definite abnormality seen
in the pelvis.

## 2022-05-22 ENCOUNTER — Ambulatory Visit: Payer: Medicaid Other | Admitting: Family Medicine
